# Patient Record
Sex: Male | Born: 1935 | Race: White | Hispanic: No | Marital: Married | Smoking: Former smoker
Health system: Southern US, Community
[De-identification: ages and names within clinical notes are randomized; demographics above are authoritative.]

## PROBLEM LIST (undated history)

## (undated) ENCOUNTER — Emergency Department (HOSPITAL_COMMUNITY): Payer: Medicare Other

## (undated) DIAGNOSIS — E785 Hyperlipidemia, unspecified: Secondary | ICD-10-CM

## (undated) DIAGNOSIS — E119 Type 2 diabetes mellitus without complications: Secondary | ICD-10-CM

---

## 1997-06-22 ENCOUNTER — Ambulatory Visit (HOSPITAL_COMMUNITY): Admission: RE | Admit: 1997-06-22 | Discharge: 1997-06-22 | Payer: Self-pay | Admitting: Plastic Surgery

## 1998-08-03 ENCOUNTER — Ambulatory Visit (HOSPITAL_BASED_OUTPATIENT_CLINIC_OR_DEPARTMENT_OTHER): Admission: RE | Admit: 1998-08-03 | Discharge: 1998-08-03 | Payer: Self-pay | Admitting: Plastic Surgery

## 1999-06-10 ENCOUNTER — Ambulatory Visit (HOSPITAL_COMMUNITY): Admission: RE | Admit: 1999-06-10 | Discharge: 1999-06-10 | Payer: Self-pay | Admitting: *Deleted

## 1999-07-01 ENCOUNTER — Inpatient Hospital Stay (HOSPITAL_COMMUNITY): Admission: RE | Admit: 1999-07-01 | Discharge: 1999-07-08 | Payer: Self-pay

## 1999-07-01 ENCOUNTER — Encounter: Payer: Self-pay | Admitting: Anesthesiology

## 2002-07-29 ENCOUNTER — Ambulatory Visit (HOSPITAL_COMMUNITY): Admission: RE | Admit: 2002-07-29 | Discharge: 2002-07-29 | Payer: Self-pay | Admitting: *Deleted

## 2002-11-25 ENCOUNTER — Ambulatory Visit (HOSPITAL_COMMUNITY): Admission: RE | Admit: 2002-11-25 | Discharge: 2002-11-25 | Payer: Self-pay | Admitting: General Surgery

## 2014-10-30 ENCOUNTER — Emergency Department (HOSPITAL_COMMUNITY): Payer: Medicare Other

## 2014-10-30 ENCOUNTER — Emergency Department (HOSPITAL_COMMUNITY)
Admission: EM | Admit: 2014-10-30 | Discharge: 2014-10-31 | Disposition: A | Discharge status: Home / Self Care | Payer: Medicare Other | Attending: Emergency Medicine | Admitting: Emergency Medicine

## 2014-10-30 ENCOUNTER — Encounter (HOSPITAL_COMMUNITY): Payer: Self-pay | Admitting: Family Medicine

## 2014-10-30 DIAGNOSIS — Z79899 Other long term (current) drug therapy: Secondary | ICD-10-CM | POA: Diagnosis not present

## 2014-10-30 DIAGNOSIS — R112 Nausea with vomiting, unspecified: Secondary | ICD-10-CM | POA: Diagnosis not present

## 2014-10-30 DIAGNOSIS — E119 Type 2 diabetes mellitus without complications: Secondary | ICD-10-CM | POA: Insufficient documentation

## 2014-10-30 DIAGNOSIS — R2689 Other abnormalities of gait and mobility: Secondary | ICD-10-CM | POA: Diagnosis not present

## 2014-10-30 DIAGNOSIS — Z87891 Personal history of nicotine dependence: Secondary | ICD-10-CM | POA: Insufficient documentation

## 2014-10-30 DIAGNOSIS — R269 Unspecified abnormalities of gait and mobility: Secondary | ICD-10-CM

## 2014-10-30 DIAGNOSIS — R5383 Other fatigue: Secondary | ICD-10-CM | POA: Diagnosis present

## 2014-10-30 HISTORY — DX: Hyperlipidemia, unspecified: E78.5

## 2014-10-30 HISTORY — DX: Type 2 diabetes mellitus without complications: E11.9

## 2014-10-30 LAB — DIFFERENTIAL
Basophils Absolute: 0 10*3/uL (ref 0.0–0.1)
Basophils Relative: 0 %
EOS ABS: 0 10*3/uL (ref 0.0–0.7)
EOS PCT: 0 %
LYMPHS PCT: 24 %
Lymphs Abs: 2.2 10*3/uL (ref 0.7–4.0)
MONO ABS: 0.6 10*3/uL (ref 0.1–1.0)
MONOS PCT: 6 %
NEUTROS ABS: 6.5 10*3/uL (ref 1.7–7.7)
Neutrophils Relative %: 70 %

## 2014-10-30 LAB — COMPREHENSIVE METABOLIC PANEL
ALBUMIN: 3.9 g/dL (ref 3.5–5.0)
ALK PHOS: 48 U/L (ref 38–126)
ALT: 13 U/L — ABNORMAL LOW (ref 17–63)
ANION GAP: 9 (ref 5–15)
AST: 24 U/L (ref 15–41)
BILIRUBIN TOTAL: 1 mg/dL (ref 0.3–1.2)
BUN: 10 mg/dL (ref 6–20)
CALCIUM: 9.2 mg/dL (ref 8.9–10.3)
CO2: 26 mmol/L (ref 22–32)
Chloride: 100 mmol/L — ABNORMAL LOW (ref 101–111)
Creatinine, Ser: 0.81 mg/dL (ref 0.61–1.24)
GLUCOSE: 107 mg/dL — AB (ref 65–99)
Potassium: 3.4 mmol/L — ABNORMAL LOW (ref 3.5–5.1)
Sodium: 135 mmol/L (ref 135–145)
TOTAL PROTEIN: 7.5 g/dL (ref 6.5–8.1)

## 2014-10-30 LAB — CBC
HEMATOCRIT: 42.8 % (ref 39.0–52.0)
Hemoglobin: 14.8 g/dL (ref 13.0–17.0)
MCH: 31.1 pg (ref 26.0–34.0)
MCHC: 34.6 g/dL (ref 30.0–36.0)
MCV: 89.9 fL (ref 78.0–100.0)
Platelets: 177 10*3/uL (ref 150–400)
RBC: 4.76 MIL/uL (ref 4.22–5.81)
RDW: 13 % (ref 11.5–15.5)
WBC: 9.2 10*3/uL (ref 4.0–10.5)

## 2014-10-30 LAB — I-STAT CHEM 8, ED
BUN: 13 mg/dL (ref 6–20)
BUN: 12 mg/dL (ref 6–20)
CALCIUM ION: 1.15 mmol/L (ref 1.13–1.30)
CHLORIDE: 102 mmol/L (ref 101–111)
Calcium, Ion: 1.18 mmol/L (ref 1.13–1.30)
Chloride: 101 mmol/L (ref 101–111)
Creatinine, Ser: 0.8 mg/dL (ref 0.61–1.24)
Creatinine, Ser: 0.7 mg/dL (ref 0.61–1.24)
GLUCOSE: 102 mg/dL — AB (ref 65–99)
Glucose, Bld: 123 mg/dL — ABNORMAL HIGH (ref 65–99)
HCT: 45 % (ref 39.0–52.0)
HCT: 46 % (ref 39.0–52.0)
Hemoglobin: 15.3 g/dL (ref 13.0–17.0)
Hemoglobin: 15.6 g/dL (ref 13.0–17.0)
Potassium: 3.7 mmol/L (ref 3.5–5.1)
Potassium: 3.5 mmol/L (ref 3.5–5.1)
Sodium: 140 mmol/L (ref 135–145)
Sodium: 139 mmol/L (ref 135–145)
TCO2: 26 mmol/L (ref 0–100)
TCO2: 24 mmol/L (ref 0–100)

## 2014-10-30 LAB — CBG MONITORING, ED: Glucose-Capillary: 105 mg/dL — ABNORMAL HIGH (ref 65–99)

## 2014-10-30 LAB — I-STAT TROPONIN, ED: TROPONIN I, POC: 0 ng/mL (ref 0.00–0.08)

## 2014-10-30 LAB — PROTIME-INR
INR: 1.16 (ref 0.00–1.49)
Prothrombin Time: 15 seconds (ref 11.6–15.2)

## 2014-10-30 LAB — URINALYSIS, ROUTINE W REFLEX MICROSCOPIC
Bilirubin Urine: NEGATIVE
Glucose, UA: NEGATIVE mg/dL
Hgb urine dipstick: NEGATIVE
Ketones, ur: NEGATIVE mg/dL
Leukocytes, UA: NEGATIVE
Nitrite: NEGATIVE
Protein, ur: NEGATIVE mg/dL
Specific Gravity, Urine: 1.01 (ref 1.005–1.030)
Urobilinogen, UA: 0.2 mg/dL (ref 0.0–1.0)
pH: 8 (ref 5.0–8.0)

## 2014-10-30 LAB — ETHANOL: Alcohol, Ethyl (B): <5 mg/dL (ref <5)

## 2014-10-30 LAB — RAPID URINE DRUG SCREEN, HOSP PERFORMED
Amphetamines: NOT DETECTED
Barbiturates: NOT DETECTED
Benzodiazepines: NOT DETECTED
Cocaine: NOT DETECTED
OPIATES: NOT DETECTED
TETRAHYDROCANNABINOL: NOT DETECTED

## 2014-10-30 LAB — APTT: aPTT: 33 seconds (ref 24–37)

## 2014-10-30 MED ORDER — LORAZEPAM 1 MG PO TABS
1.0000 mg | ORAL_TABLET | Freq: Once | ORAL | Status: AC
  Administered 2014-10-30: 1 mg via ORAL
  Filled 2014-10-30: qty 1

## 2014-10-30 NOTE — ED Notes (Signed)
Pt here for unsteady gait that started yesterday. Denies any other problems.

## 2014-10-30 NOTE — ED Notes (Signed)
Pt refused to allow temperature to be taken.

## 2014-10-30 NOTE — ED Notes (Signed)
Called pt no answer °

## 2014-10-30 NOTE — ED Notes (Signed)
Called pt several times with no answer, pt returned to front desk asking when he would be called

## 2014-10-30 NOTE — Discharge Instructions (Signed)
Fall Prevention in the Home  There is no evidence of a new stroke. Followup with your doctor. Return to the ED if you develop new or worsening symptoms. Falls can cause injuries and can affect people from all age groups. There are many simple things that you can do to make your home safe and to help prevent falls. WHAT CAN I DO ON THE OUTSIDE OF MY HOME?  Regularly repair the edges of walkways and driveways and fix any cracks.  Remove high doorway thresholds.  Trim any shrubbery on the main path into your home.  Use bright outdoor lighting.  Clear walkways of debris and clutter, including tools and rocks.  Regularly check that handrails are securely fastened and in good repair. Both sides of any steps should have handrails.  Install guardrails along the edges of any raised decks or porches.  Have leaves, snow, and ice cleared regularly.  Use sand or salt on walkways during winter months.  In the garage, clean up any spills right away, including grease or oil spills. WHAT CAN I DO IN THE BATHROOM?  Use night lights.  Install grab bars by the toilet and in the tub and shower. Do not use towel bars as grab bars.  Use non-skid mats or decals on the floor of the tub or shower.  If you need to sit down while you are in the shower, use a plastic, non-slip stool.Marland Kitchen  Keep the floor dry. Immediately clean up any water that spills on the floor.  Remove soap buildup in the tub or shower on a regular basis.  Attach bath mats securely with double-sided non-slip rug tape.  Remove throw rugs and other tripping hazards from the floor. WHAT CAN I DO IN THE BEDROOM?  Use night lights.  Make sure that a bedside light is easy to reach.  Do not use oversized bedding that drapes onto the floor.  Have a firm chair that has side arms to use for getting dressed.  Remove throw rugs and other tripping hazards from the floor. WHAT CAN I DO IN THE KITCHEN?   Clean up any spills right  away.  Avoid walking on wet floors.  Place frequently used items in easy-to-reach places.  If you need to reach for something above you, use a sturdy step stool that has a grab bar.  Keep electrical cables out of the way.  Do not use floor polish or wax that makes floors slippery. If you have to use wax, make sure that it is non-skid floor wax.  Remove throw rugs and other tripping hazards from the floor. WHAT CAN I DO IN THE STAIRWAYS?  Do not leave any items on the stairs.  Make sure that there are handrails on both sides of the stairs. Fix handrails that are broken or loose. Make sure that handrails are as long as the stairways.  Check any carpeting to make sure that it is firmly attached to the stairs. Fix any carpet that is loose or worn.  Avoid having throw rugs at the top or bottom of stairways, or secure the rugs with carpet tape to prevent them from moving.  Make sure that you have a light switch at the top of the stairs and the bottom of the stairs. If you do not have them, have them installed. WHAT ARE SOME OTHER FALL PREVENTION TIPS?  Wear closed-toe shoes that fit well and support your feet. Wear shoes that have rubber soles or low heels.  When you use  a stepladder, make sure that it is completely opened and that the sides are firmly locked. Have someone hold the ladder while you are using it. Do not climb a closed stepladder.  Add color or contrast paint or tape to grab bars and handrails in your home. Place contrasting color strips on the first and last steps.  Use mobility aids as needed, such as canes, walkers, scooters, and crutches.  Turn on lights if it is dark. Replace any light bulbs that burn out.  Set up furniture so that there are clear paths. Keep the furniture in the same spot.  Fix any uneven floor surfaces.  Choose a carpet design that does not hide the edge of steps of a stairway.  Be aware of any and all pets.  Review your medicines with your  healthcare provider. Some medicines can cause dizziness or changes in blood pressure, which increase your risk of falling. Talk with your health care provider about other ways that you can decrease your risk of falls. This may include working with a physical therapist or trainer to improve your strength, balance, and endurance.   This information is not intended to replace advice given to you by your health care provider. Make sure you discuss any questions you have with your health care provider.   Document Released: 12/23/2001 Document Revised: 05/19/2014 Document Reviewed: 02/06/2014 Elsevier Interactive Patient Education [employer].

## 2014-10-30 NOTE — ED Notes (Signed)
[facility] contacted to transport patient to home 

## 2014-10-30 NOTE — ED Provider Notes (Signed)
CSN: [id number]     Arrival date & time 10/30/14  1230 History   First MD Initiated Contact with Patient 10/30/14 1620     Chief Complaint  Patient presents with  . Gait Problem     (Consider location/radiation/quality/duration/timing/severity/associated sxs/prior Treatment) HPI Comments: Patient is a poor historian.  He is not sure why he is in the hospital.  I report he was sent for unsteady gait for the past couple days with feeling like he was going to fall over. He denies any headache, neck pain, back pain, chest pain or abdominal pain. Denies any chronic medications other than vitamins. Denies any heart or lung problems. He states he's been having difficulty walking and had a fall yesterday did not hit his head. Did not lose consciousness. No focal numbness tingling. No vision changes. No chest pain or shortness of breath. No abdominal pain. Had one episode of vomiting today. No focal weakness, numbness or tingling. No difficulty with speech or swallowing.  The history is provided by the patient. The history is limited by the condition of the patient.    Past Medical History  Diagnosis Date  . Diabetes mellitus without complication ([place])   . Hyperlipidemia    History reviewed. No pertinent past surgical history. History reviewed. No pertinent family history. Social History  Substance Use Topics  . Smoking status: Former Research scientist (life sciences)  . Smokeless tobacco: None  . Alcohol Use: Yes    Review of Systems  Constitutional: Positive for fatigue. Negative for fever, activity change and appetite change.  Eyes: Negative for visual disturbance.  Respiratory: Negative for cough, chest tightness and shortness of breath.   Gastrointestinal: Positive for nausea and vomiting. Negative for abdominal pain.  Genitourinary: Negative for dysuria and hematuria.  Musculoskeletal: Positive for gait problem. Negative for myalgias and arthralgias.  Neurological: Negative for dizziness, weakness,  light-headedness and headaches.  A complete 10 system review of systems was obtained and all systems are negative except as noted in the HPI and PMH.      Allergies  Review of patient's allergies indicates no known allergies.  Home Medications   Prior to Admission medications   Medication Sig Start Date End Date Taking? Authorizing Provider  Multiple Vitamin (MULTIVITAMIN WITH MINERALS) TABS tablet Take 1 tablet by mouth daily.   Yes Historical Provider, MD  sennosides-docusate sodium (SENOKOT-S) 8.6-50 MG tablet Take 1 tablet by mouth daily.   Yes Historical Provider, MD   BP 100/82 mmHg  Pulse 77  Temp(Src) 97.7 F (36.5 C) (Oral)  Resp 18  Ht '5\' 4"'$  (1.626 m)  Wt 165 lb (74.844 kg)  BMI 28.31 kg/m2  SpO2 94% Physical Exam  Constitutional: He is oriented to person, place, and time. He appears well-developed and well-nourished. No distress.  HENT:  Head: Normocephalic and atraumatic.  Mouth/Throat: Oropharynx is clear and moist. No oropharyngeal exudate.  Eyes: Conjunctivae and EOM are normal. Pupils are equal, round, and reactive to light.  Neck: Normal range of motion. Neck supple.  No meningismus.  Cardiovascular: Normal rate, regular rhythm, normal heart sounds and intact distal pulses.   No murmur heard. Pulmonary/Chest: Effort normal and breath sounds normal. No respiratory distress.  Abdominal: Soft. There is no tenderness. There is no rebound and no guarding.  Musculoskeletal: Normal range of motion. He exhibits no edema or tenderness.  Neurological: He is alert and oriented to person, place, and time. No cranial nerve deficit. He exhibits normal muscle tone. Coordination normal.  No ataxia on finger to  nose bilaterally. No pronator drift. 5/5 strength throughout. CN 2-12 intact. Positive Romberg. Equal grip strength. Sensation intact. Gait is slightly ataxic. Left-sided horizontal nystagmus.  Skin: Skin is warm.  Psychiatric: He has a normal mood and affect. His  behavior is normal.  Nursing note and vitals reviewed.   ED Course  Procedures (including critical care time) Labs Review Labs Reviewed  URINALYSIS, ROUTINE W REFLEX MICROSCOPIC (NOT AT [facility]) - Abnormal; Notable for the following:    APPearance CLOUDY (*)    All other components within normal limits  COMPREHENSIVE METABOLIC PANEL - Abnormal; Notable for the following:    Potassium 3.4 (*)    Chloride 100 (*)    Glucose, Bld 107 (*)    ALT 13 (*)    All other components within normal limits  CBG MONITORING, ED - Abnormal; Notable for the following:    Glucose-Capillary 105 (*)    All other components within normal limits  I-STAT CHEM 8, ED - Abnormal; Notable for the following:    Glucose, Bld 123 (*)    All other components within normal limits  I-STAT CHEM 8, ED - Abnormal; Notable for the following:    Glucose, Bld 102 (*)    All other components within normal limits  ETHANOL  PROTIME-INR  APTT  CBC  DIFFERENTIAL  URINE RAPID DRUG SCREEN, HOSP PERFORMED  I-STAT TROPOININ, ED  CBG MONITORING, ED    Imaging Review Ct Head Wo Contrast  10/30/2014  CLINICAL DATA:  Difficulty walking for 2 days. EXAM: CT HEAD WITHOUT CONTRAST TECHNIQUE: Contiguous axial images were obtained from the base of the skull through the vertex without intravenous contrast. COMPARISON:  None. FINDINGS: Patchy and confluent hypoattenuation in the subcortical and periventricular deep white matter is consistent with chronic microvascular ischemic change. There is cortical atrophy. No evidence of acute abnormality including hemorrhage, infarct, mass lesion, mass effect, midline shift or abnormal extra-axial fluid collection is identified. There is no hydrocephalus or pneumocephalus. The calvarium is intact. Imaged paranasal sinuses and mastoid air cells are clear. IMPRESSION: No acute abnormality. Atrophy and chronic microvascular ischemic change. Electronically Signed   By: Inge Rise M.D.   On:  10/30/2014 18:02   Mr Brain Wo Contrast  10/30/2014  CLINICAL DATA:  Initial evaluation for one-day history of unsteady gait. EXAM: MRI HEAD WITHOUT CONTRAST TECHNIQUE: Multiplanar, multiecho pulse sequences of the brain and surrounding structures were obtained without intravenous contrast. COMPARISON:  Prior CT from earlier the same day. FINDINGS: Study is moderately degraded by motion artifact. Diffuse prominence of the CSF containing spaces is compatible with generalized age-related cerebral atrophy. Hippocampi are markedly atrophic. Patchy T2/FLAIR hyperintensity within the periventricular and deep white matter both cerebral hemispheres most consistent with chronic small vessel ischemic disease, moderate in nature. Remote lacunar infarcts within the right thalamus and posterior left lentiform nucleus. Additional small remote lacunar infarct within the left thalamus. No abnormal foci of restricted diffusion to suggest acute intracranial infarct. Gray-white matter differentiation maintained. Normal intravascular flow voids are preserved. No acute or chronic intracranial hemorrhage. There is an oblong extra-axial mass measuring approximately 13 x 7 x 10 mm along the posterior aspect of the left petrous apex in the left pre pontine cistern (series 6, image 9). This lesion appears partially calcified on prior CT, and likely reflects a small meningioma. No significant mass effect. This lesion closely approximates the basilar artery medially without displacement or impingement. No other mass lesion. No midline shift or mass effect. Ventricular prominence  related to global parenchymal volume loss present without hydrocephalus. No extra-axial fluid collection. Craniocervical junction within normal limits. Pituitary gland normal.  No acute abnormality about the orbits. Paranasal sinuses are grossly clear. No mastoid effusion. Inner ear structures grossly unremarkable. Bone marrow signal intensity within normal limits.  Mild degenerative changes about the dens. Scalp soft tissues within normal limits. IMPRESSION: 1. No acute intracranial infarct or other process identified. 2. Advanced age-related cerebral atrophy with moderate chronic small vessel ischemic disease and remote lacunar infarcts within the bilateral thalami and left basal ganglia. 3. 13 x 7 x 10 mm extra-axial mass within the left pre pontine cistern, likely a small meningioma. No associated edema. Electronically Signed   By: Jeannine Boga M.D.   On: 10/30/2014 22:39   I have personally reviewed and evaluated these images and lab results as part of my medical decision-making.   EKG Interpretation   Date/Time:  Friday October 30 2014 17:14:26 EDT Ventricular Rate:  76 PR Interval:  196 QRS Duration: 95 QT Interval:  418 QTC Calculation: 470 R Axis:   3 Text Interpretation:  Sinus rhythm No significant change was found  Confirmed by Wyvonnia Dusky  MD, Emanie Behan [phone]) on 10/30/2014 5:21:02 PM      MDM   Final diagnoses:  Gait disturbance   Patient with difficulty walking for the past several days. States he had one fall. Denies any trauma. No focal neurological deficits but does have nystagmus and positive Romberg with ataxic gait.  CT head negative.  Labs unremarkable.  With nystagmus and difficulty walking, will proceed with MRI.  MRI is negative for acute infarct. There are areas of atrophy and chronic small vessel disease of the right lacunar infarcts. There is a small likely meningioma.  Patient is able to ambulate without assistance. Denies any dizziness. He appears stable for discharge. No evidence of infection.  No family with patient after prolonged ED stay.  Does appear to have some element of dementia. He does not know any other phone numbers and no phone numbers in contacts in his cell phone.  Nurse spoke with patient's wife but on subsequent attempts to contact her, the number was busy.  [facility] transported patient home but  there was no answer so he was brought back to the ED.  Will need to stay in ED overnight for his safety and involve social work in the morning.   Ezequiel Essex, MD 10/31/14 [phone]

## 2014-10-30 NOTE — ED Notes (Signed)
Pt cbg 105

## 2014-10-31 NOTE — ED Notes (Addendum)
Pt was transported back to the ED after there was no answer at his home.  Pt is disoriented to the situation.  He does follow commands however appears to wonder.  Pt did agree to try to sleep.  Pt requested that both rails on his bed be left up so that "I have something to grab."

## 2014-10-31 NOTE — ED Notes (Signed)
Tech safety sitting the pt. Pt stated that he wanted to "go home and go to bed".

## 2014-10-31 NOTE — ED Notes (Signed)
Pt taken home by [facility]

## 2014-10-31 NOTE — ED Notes (Signed)
Received information that [facility]O had gone to the home to do a wellfare check on pt's wife.  She is home and answered the door.  Spoke to MD who informed RN that pt could be transferred home via [facility]

## 2015-08-31 ENCOUNTER — Other Ambulatory Visit: Payer: Self-pay | Admitting: Family Medicine

## 2015-08-31 DIAGNOSIS — R9389 Abnormal findings on diagnostic imaging of other specified body structures: Secondary | ICD-10-CM

## 2015-09-10 ENCOUNTER — Other Ambulatory Visit: Payer: Medicare Other

## 2015-11-10 ENCOUNTER — Ambulatory Visit: Payer: Medicare Other | Admitting: Podiatry

## 2015-11-18 ENCOUNTER — Ambulatory Visit: Payer: Medicare Other | Admitting: Podiatry

## 2015-11-24 ENCOUNTER — Ambulatory Visit: Payer: Medicare Other | Admitting: Podiatry

## 2015-12-08 ENCOUNTER — Ambulatory Visit: Payer: Medicare Other | Admitting: Podiatry

## 2016-02-10 ENCOUNTER — Other Ambulatory Visit: Payer: Self-pay | Admitting: Family Medicine

## 2016-02-10 DIAGNOSIS — R079 Chest pain, unspecified: Secondary | ICD-10-CM

## 2016-02-10 DIAGNOSIS — R9389 Abnormal findings on diagnostic imaging of other specified body structures: Secondary | ICD-10-CM

## 2016-02-14 ENCOUNTER — Ambulatory Visit
Admission: RE | Admit: 2016-02-14 | Discharge: 2016-02-14 | Disposition: A | Discharge status: Home / Self Care | Payer: Medicare Other | Source: Ambulatory Visit | Attending: Family Medicine | Admitting: Family Medicine

## 2016-02-14 DIAGNOSIS — R9389 Abnormal findings on diagnostic imaging of other specified body structures: Secondary | ICD-10-CM

## 2016-02-14 DIAGNOSIS — R079 Chest pain, unspecified: Secondary | ICD-10-CM

## 2016-02-14 MED ORDER — IOPAMIDOL (ISOVUE-300) INJECTION 61%
75.0000 mL | Freq: Once | INTRAVENOUS | Status: AC | PRN
  Administered 2016-02-14: 75 mL via INTRAVENOUS

## 2016-02-15 ENCOUNTER — Encounter: Payer: Self-pay | Admitting: *Deleted

## 2016-02-15 ENCOUNTER — Telehealth: Payer: Self-pay | Admitting: *Deleted

## 2016-02-15 DIAGNOSIS — R911 Solitary pulmonary nodule: Secondary | ICD-10-CM

## 2016-02-15 DIAGNOSIS — R918 Other nonspecific abnormal finding of lung field: Secondary | ICD-10-CM | POA: Insufficient documentation

## 2016-02-15 NOTE — Telephone Encounter (Signed)
Oncology Nurse Navigator Documentation  Oncology Nurse Navigator Flowsheets 02/15/2016  Navigator Location [facility]  Referral date to RadOnc/MedOnc 02/15/2016  Navigator Encounter Type Other;Telephone/I received referral today on Mitchell Grant.  Per [facility] protocol, Dr. Harrington Challenger ordered PET scan. I completed order in EPIC.  I called Mitchell Grant to update him on next steps but was unable to reach.  I updated Dr. Julien Nordmann on the above information.   Telephone Outgoing Call  Abnormal Finding Date 02/14/2016  Treatment Phase Abnormal Scans  Barriers/Navigation Needs Coordination of Care  Interventions Coordination of Care  Coordination of Care Radiology;Appts  Acuity Level 2  Acuity Level 2 Assistance expediting appointments;Other  Time Spent with Patient 30

## 2016-02-16 ENCOUNTER — Telehealth: Payer: Self-pay | Admitting: *Deleted

## 2016-02-16 NOTE — Telephone Encounter (Signed)
Oncology Nurse Navigator Documentation  Oncology Nurse Navigator Flowsheets 02/16/2016  Navigator Location [facility]  Navigator Encounter Type Telephone/I called to follow up with Mr. Kishi.  I spoke with Ms. Loja and she stated patient was asleep and to call back tomorrow.  I called the referring office and spoke with [facility].  I updated her on wife's response.  Beth was thankful for the call and will update Dr. Harrington Challenger.   Telephone Outgoing Call  Treatment Phase Abnormal Scans  Barriers/Navigation Needs Education  Education Other  Interventions Education  Acuity Level 2  Acuity Level 2 Educational needs  Time Spent with Patient 30

## 2016-02-22 ENCOUNTER — Other Ambulatory Visit: Payer: Self-pay | Admitting: Family Medicine

## 2016-03-06 ENCOUNTER — Emergency Department (HOSPITAL_COMMUNITY): Payer: Medicare Other

## 2016-03-06 ENCOUNTER — Encounter (HOSPITAL_COMMUNITY): Payer: Self-pay | Admitting: Emergency Medicine

## 2016-03-06 ENCOUNTER — Inpatient Hospital Stay (HOSPITAL_COMMUNITY)
Admission: EM | Admit: 2016-03-06 | Discharge: 2016-03-09 | DRG: 071 | Disposition: A | Discharge status: Hospice | Payer: Medicare Other | Attending: Internal Medicine | Admitting: Internal Medicine

## 2016-03-06 DIAGNOSIS — Z66 Do not resuscitate: Secondary | ICD-10-CM | POA: Diagnosis present

## 2016-03-06 DIAGNOSIS — E785 Hyperlipidemia, unspecified: Secondary | ICD-10-CM | POA: Diagnosis present

## 2016-03-06 DIAGNOSIS — I160 Hypertensive urgency: Secondary | ICD-10-CM | POA: Diagnosis not present

## 2016-03-06 DIAGNOSIS — M6282 Rhabdomyolysis: Secondary | ICD-10-CM

## 2016-03-06 DIAGNOSIS — G9341 Metabolic encephalopathy: Secondary | ICD-10-CM | POA: Diagnosis not present

## 2016-03-06 DIAGNOSIS — R410 Disorientation, unspecified: Secondary | ICD-10-CM

## 2016-03-06 DIAGNOSIS — G9340 Encephalopathy, unspecified: Secondary | ICD-10-CM | POA: Diagnosis not present

## 2016-03-06 DIAGNOSIS — Z515 Encounter for palliative care: Secondary | ICD-10-CM | POA: Diagnosis present

## 2016-03-06 DIAGNOSIS — N179 Acute kidney failure, unspecified: Secondary | ICD-10-CM | POA: Diagnosis present

## 2016-03-06 DIAGNOSIS — Z8673 Personal history of transient ischemic attack (TIA), and cerebral infarction without residual deficits: Secondary | ICD-10-CM

## 2016-03-06 DIAGNOSIS — E119 Type 2 diabetes mellitus without complications: Secondary | ICD-10-CM | POA: Diagnosis present

## 2016-03-06 DIAGNOSIS — W19XXXA Unspecified fall, initial encounter: Secondary | ICD-10-CM

## 2016-03-06 DIAGNOSIS — R627 Adult failure to thrive: Secondary | ICD-10-CM | POA: Diagnosis present

## 2016-03-06 DIAGNOSIS — C3432 Malignant neoplasm of lower lobe, left bronchus or lung: Secondary | ICD-10-CM | POA: Diagnosis present

## 2016-03-06 DIAGNOSIS — E876 Hypokalemia: Secondary | ICD-10-CM | POA: Diagnosis present

## 2016-03-06 DIAGNOSIS — Z87891 Personal history of nicotine dependence: Secondary | ICD-10-CM

## 2016-03-06 DIAGNOSIS — I10 Essential (primary) hypertension: Secondary | ICD-10-CM | POA: Diagnosis present

## 2016-03-06 DIAGNOSIS — Z79899 Other long term (current) drug therapy: Secondary | ICD-10-CM

## 2016-03-06 DIAGNOSIS — E869 Volume depletion, unspecified: Secondary | ICD-10-CM | POA: Diagnosis present

## 2016-03-06 DIAGNOSIS — F03918 Unspecified dementia, unspecified severity, with other behavioral disturbance: Secondary | ICD-10-CM

## 2016-03-06 DIAGNOSIS — D72829 Elevated white blood cell count, unspecified: Secondary | ICD-10-CM | POA: Diagnosis present

## 2016-03-06 DIAGNOSIS — F0391 Unspecified dementia with behavioral disturbance: Secondary | ICD-10-CM | POA: Diagnosis present

## 2016-03-06 DIAGNOSIS — R918 Other nonspecific abnormal finding of lung field: Secondary | ICD-10-CM

## 2016-03-06 DIAGNOSIS — E860 Dehydration: Secondary | ICD-10-CM | POA: Diagnosis present

## 2016-03-06 LAB — COMPREHENSIVE METABOLIC PANEL
ALBUMIN: 4.2 g/dL (ref 3.5–5.0)
ALT: 44 U/L (ref 17–63)
ANION GAP: 13 (ref 5–15)
AST: 56 U/L — ABNORMAL HIGH (ref 15–41)
Alkaline Phosphatase: 52 U/L (ref 38–126)
BILIRUBIN TOTAL: 3 mg/dL — AB (ref 0.3–1.2)
BUN: 26 mg/dL — ABNORMAL HIGH (ref 6–20)
CO2: 27 mmol/L (ref 22–32)
Calcium: 9.7 mg/dL (ref 8.9–10.3)
Chloride: 105 mmol/L (ref 101–111)
Creatinine, Ser: 0.85 mg/dL (ref 0.61–1.24)
GLUCOSE: 120 mg/dL — AB (ref 65–99)
Potassium: 3.2 mmol/L — ABNORMAL LOW (ref 3.5–5.1)
Sodium: 145 mmol/L (ref 135–145)
TOTAL PROTEIN: 7.8 g/dL (ref 6.5–8.1)

## 2016-03-06 LAB — CBC WITH DIFFERENTIAL/PLATELET
BASOS PCT: 0 %
Basophils Absolute: 0 10*3/uL (ref 0.0–0.1)
Eosinophils Absolute: 0 10*3/uL (ref 0.0–0.7)
Eosinophils Relative: 0 %
HEMATOCRIT: 44.5 % (ref 39.0–52.0)
Hemoglobin: 15.9 g/dL (ref 13.0–17.0)
Lymphocytes Relative: 13 %
Lymphs Abs: 1.7 10*3/uL (ref 0.7–4.0)
MCH: 30.5 pg (ref 26.0–34.0)
MCHC: 35.7 g/dL (ref 30.0–36.0)
MCV: 85.2 fL (ref 78.0–100.0)
MONO ABS: 0.8 10*3/uL (ref 0.1–1.0)
MONOS PCT: 6 %
NEUTROS ABS: 10.9 10*3/uL — AB (ref 1.7–7.7)
Neutrophils Relative %: 81 %
Platelets: 164 10*3/uL (ref 150–400)
RBC: 5.22 MIL/uL (ref 4.22–5.81)
RDW: 13.6 % (ref 11.5–15.5)
WBC: 13.4 10*3/uL — ABNORMAL HIGH (ref 4.0–10.5)

## 2016-03-06 LAB — URINALYSIS, ROUTINE W REFLEX MICROSCOPIC
BILIRUBIN URINE: NEGATIVE
Bacteria, UA: NONE SEEN
GLUCOSE, UA: NEGATIVE mg/dL
KETONES UR: 20 mg/dL — AB
LEUKOCYTES UA: NEGATIVE
NITRITE: NEGATIVE
PH: 5 (ref 5.0–8.0)
Protein, ur: NEGATIVE mg/dL
Specific Gravity, Urine: 1.024 (ref 1.005–1.030)

## 2016-03-06 LAB — AMMONIA: Ammonia: 12 umol/L (ref 9–35)

## 2016-03-06 LAB — LACTIC ACID, PLASMA: Lactic Acid, Venous: 1.2 mmol/L (ref 0.5–1.9)

## 2016-03-06 LAB — CK: Total CK: 671 U/L — ABNORMAL HIGH (ref 49–397)

## 2016-03-06 LAB — ETHANOL: Alcohol, Ethyl (B): <5 mg/dL (ref <5)

## 2016-03-06 MED ORDER — ONDANSETRON HCL 4 MG PO TABS: 4.0000 mg | ORAL_TABLET | Freq: Four times a day (QID) | ORAL | Status: DC | PRN

## 2016-03-06 MED ORDER — SODIUM CHLORIDE 0.9 % IV SOLN
INTRAVENOUS | Status: DC
  Administered 2016-03-07: 01:00:00 via INTRAVENOUS

## 2016-03-06 MED ORDER — AMLODIPINE BESYLATE 5 MG PO TABS: 5.0000 mg | ORAL_TABLET | Freq: Every day | ORAL | Status: DC

## 2016-03-06 MED ORDER — ACETAMINOPHEN 325 MG PO TABS: 650.0000 mg | ORAL_TABLET | Freq: Four times a day (QID) | ORAL | Status: DC | PRN

## 2016-03-06 MED ORDER — ACETAMINOPHEN 650 MG RE SUPP
650.0000 mg | Freq: Four times a day (QID) | RECTAL | Status: DC | PRN
Start: 2016-03-06 — End: 2016-03-09

## 2016-03-06 MED ORDER — SODIUM CHLORIDE 0.9 % IV BOLUS (SEPSIS)
1000.0000 mL | Freq: Once | INTRAVENOUS | Status: AC
  Administered 2016-03-06: 1000 mL via INTRAVENOUS

## 2016-03-06 MED ORDER — ONDANSETRON HCL 4 MG/2ML IJ SOLN: 4.0000 mg | Freq: Four times a day (QID) | INTRAMUSCULAR | Status: DC | PRN

## 2016-03-06 MED ORDER — HYDRALAZINE HCL 20 MG/ML IJ SOLN
10.0000 mg | INTRAMUSCULAR | Status: DC | PRN
  Administered 2016-03-06 – 2016-03-09 (×3): 10 mg via INTRAVENOUS
  Filled 2016-03-06 (×3): qty 1

## 2016-03-06 NOTE — ED Triage Notes (Addendum)
Per EMS-states lives at home with wife-APS at residence due to hoarding-APS found patient on floor-states unwitnessed fall-unsure of LOC-no s/s's of distress-patient asleep on the stretcher-patient alert and oriented X 2-complaining of pain at back of head-history of dementia-patient is at baseline mentally

## 2016-03-06 NOTE — ED Notes (Signed)
Lab reports adding CK

## 2016-03-06 NOTE — H&P (Addendum)
History and Physical    Mitchell Grant DQQ:[id number] DOB: 03-04-1930 DOA: 03/06/2016  PCP: No PCP Per Patient  Patient coming from: Home.  History obtained from ER physician as patient is confused and no family at the bedside.  Chief Complaint: Confusion.  HPI: Mitchell Grant is a 83 y.o. male with unknown past medical history was brought to the ER after Adult Protective Services went to check on the patient and was found to be on the floor. Patient had urine and feces in his underwear. In the ER patient remained confused and oriented to his name only. Was afebrile. CT of the head and C-spine did not show anything acute. Blood pressure remained high. Patient is being admitted for further observation. Unable to reach patient's wife who usually lives with patient. Reviewing patient's charts last month patient had x-ray and CT scan of the chest which showed new mass concerning for malignancy.   ED Course: CT of the head and C-spine was unremarkable. UA did not show any signs of infection.  Review of Systems: As per HPI, rest all negative.   Past Medical History:  Diagnosis Date  . Diabetes mellitus without complication ([place])   . Hyperlipidemia     History reviewed. No pertinent surgical history.   reports that he has quit smoking. He has never used smokeless tobacco. He reports that he drinks alcohol. He reports that he does not use drugs.  No Known Allergies  Family History  Problem Relation Age of Onset  . Family history unknown: Yes    Prior to Admission medications   Medication Sig Start Date End Date Taking? Authorizing Provider  Multiple Vitamin (MULTIVITAMIN WITH MINERALS) TABS tablet Take 1 tablet by mouth daily.    Historical Provider, MD  sennosides-docusate sodium (SENOKOT-S) 8.6-50 MG tablet Take 1 tablet by mouth daily.    Historical Provider, MD    Physical Exam: Vitals:   03/06/16 1930 03/06/16 2039 03/06/16 2100 03/06/16 2303  BP: 182/75 173/86 183/90  (!) 212/98  Pulse: (!) 56 (!) 56 (!) 55 70  Resp: (!) '9 12 10 13  '$ Temp:      TempSrc:      SpO2: 95% 96% 96% 97%      Constitutional: Moderately built and nourished. Vitals:   03/06/16 1930 03/06/16 2039 03/06/16 2100 03/06/16 2303  BP: 182/75 173/86 183/90 (!) 212/98  Pulse: (!) 56 (!) 56 (!) 55 70  Resp: (!) '9 12 10 13  '$ Temp:      TempSrc:      SpO2: 95% 96% 96% 97%   Eyes: Anicteric no pallor. ENMT: No discharge from the ears eyes nose or mouth. Neck: No mass felt. No neck rigidity. Respiratory: No rhonchi or crepitations. Cardiovascular: S1-S2 heard no murmurs appreciated. Abdomen: Soft nontender bowel sounds present. Musculoskeletal: No edema no joint effusion. Skin: No rash. Neurologic: Alert awake oriented to his name. Moves all extremities. Appears confused. Pupils are equal and reacting to light. Psychiatric: Appears confused.   Labs on Admission: I have personally reviewed following labs and imaging studies  CBC:  Recent Labs Lab 03/06/16 1714  WBC 13.4*  NEUTROABS 10.9*  HGB 15.9  HCT 44.5  MCV 85.2  PLT 194   Basic Metabolic Panel:  Recent Labs Lab 03/06/16 1714  NA 145  K 3.2*  CL 105  CO2 27  GLUCOSE 120*  BUN 26*  CREATININE 0.85  CALCIUM 9.7   GFR: CrCl cannot be calculated (Unknown ideal weight.). Liver Function  Tests:  Recent Labs Lab 03/06/16 1714  AST 56*  ALT 44  ALKPHOS 52  BILITOT 3.0*  PROT 7.8  ALBUMIN 4.2   No results for input(s): LIPASE, AMYLASE in the last 168 hours.  Recent Labs Lab 03/06/16 1827  AMMONIA 12   Coagulation Profile: No results for input(s): INR, PROTIME in the last 168 hours. Cardiac Enzymes:  Recent Labs Lab 03/06/16 1714  CKTOTAL 671*   BNP (last 3 results) No results for input(s): PROBNP in the last 8760 hours. HbA1C: No results for input(s): HGBA1C in the last 72 hours. CBG: No results for input(s): GLUCAP in the last 168 hours. Lipid Profile: No results for input(s):  CHOL, HDL, LDLCALC, TRIG, CHOLHDL, LDLDIRECT in the last 72 hours. Thyroid Function Tests: No results for input(s): TSH, T4TOTAL, FREET4, T3FREE, THYROIDAB in the last 72 hours. Anemia Panel: No results for input(s): VITAMINB12, FOLATE, FERRITIN, TIBC, IRON, RETICCTPCT in the last 72 hours. Urine analysis:    Component Value Date/Time   COLORURINE AMBER (A) 03/06/2016 2243   APPEARANCEUR CLEAR 03/06/2016 2243   LABSPEC 1.024 03/06/2016 2243   PHURINE 5.0 03/06/2016 2243   GLUCOSEU NEGATIVE 03/06/2016 2243   HGBUR SMALL (A) 03/06/2016 2243   BILIRUBINUR NEGATIVE 03/06/2016 2243   KETONESUR 20 (A) 03/06/2016 2243   PROTEINUR NEGATIVE 03/06/2016 2243   UROBILINOGEN 0.2 10/30/2014 1348   NITRITE NEGATIVE 03/06/2016 2243   LEUKOCYTESUR NEGATIVE 03/06/2016 2243   Sepsis Labs: '@LABRCNTIP'$ (procalcitonin:4,lacticidven:4) )No results found for this or any previous visit (from the past 240 hour(s)).   Radiological Exams on Admission: Ct Head Wo Contrast  Result Date: 03/06/2016 CLINICAL DATA:  Patient found down today. EXAM: CT HEAD WITHOUT CONTRAST CT CERVICAL SPINE WITHOUT CONTRAST TECHNIQUE: Multidetector CT imaging of the head and cervical spine was performed following the standard protocol without intravenous contrast. Multiplanar CT image reconstructions of the cervical spine were also generated. COMPARISON:  Head CT scan and brain MRI 10/30/2014. FINDINGS: CT HEAD FINDINGS Brain: Atrophy and chronic microvascular ischemic change are identified. Small prepontine mass on the left measures approximately 1 cm in diameter and is most consistent with a meningioma, unchanged. No other mass is identified. No evidence of acute infarct, hemorrhage, midline shift or abnormal extra-axial fluid collection. No hydrocephalus or pneumocephalus. Vascular: Atherosclerosis noted. Skull: Intact. Sinuses/Orbits: Mild mucosal thickening left maxillary sinus is seen. No acute abnormality. Other: None. CT CERVICAL  SPINE FINDINGS Alignment: Facet mediated 0.3 cm anterolisthesis C4 on C5 is identified. Otherwise maintained. Convex right scoliosis is noted. Skull base and vertebrae: No fracture or worrisome lesion. 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 Soft tissues and spinal canal: Carotid atherosclerosis is identified. No hematoma. No prevertebral soft tissue swelling. Disc levels: Loss of disc space height is worst at C6-7. Scattered facet degenerative change appears worst on the left at C4-5. Upper chest: Lung apices are clear. Other: None. IMPRESSION: No acute abnormality head or cervical spine. Atrophy and chronic microvascular ischemic change. No change in a small prepontine meningioma. 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 Cervical spondylosis appears worst at C5-6. Atherosclerosis. Electronically Signed   By: Inge Rise M.D.   On: 03/06/2016 17:55   Ct Cervical Spine Wo Contrast  Result Date: 03/06/2016 CLINICAL DATA:  Patient found down today. EXAM: CT HEAD WITHOUT CONTRAST CT CERVICAL SPINE WITHOUT CONTRAST TECHNIQUE: Multidetector CT imaging of the head and cervical spine was performed following the standard protocol without intravenous contrast. Multiplanar CT image reconstructions of the cervical spine were also generated. COMPARISON:  Head CT  scan and brain MRI 10/30/2014. FINDINGS: CT HEAD FINDINGS Brain: Atrophy and chronic microvascular ischemic change are identified. Small prepontine mass on the left measures approximately 1 cm in diameter and is most consistent with a meningioma, unchanged. No other mass is identified. No evidence of acute infarct, hemorrhage, midline shift or abnormal extra-axial fluid collection. No hydrocephalus or pneumocephalus. Vascular: Atherosclerosis noted. Skull: Intact. Sinuses/Orbits: Mild mucosal thickening left maxillary sinus is seen. No acute abnormality. Other: None. CT CERVICAL SPINE FINDINGS Alignment: Facet mediated 0.3 cm anterolisthesis C4 on C5 is identified. Otherwise  maintained. Convex right scoliosis is noted. Skull base and vertebrae: No fracture or worrisome lesion. 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 Soft tissues and spinal canal: Carotid atherosclerosis is identified. No hematoma. No prevertebral soft tissue swelling. Disc levels: Loss of disc space height is worst at C6-7. Scattered facet degenerative change appears worst on the left at C4-5. Upper chest: Lung apices are clear. Other: None. IMPRESSION: No acute abnormality head or cervical spine. Atrophy and chronic microvascular ischemic change. No change in a small prepontine meningioma. 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 Cervical spondylosis appears worst at C5-6. Atherosclerosis. Electronically Signed   By: Inge Rise M.D.   On: 03/06/2016 17:55     Assessment/Plan Principal Problem:   Acute encephalopathy Active Problems:   Hypertensive urgency    1. Acute encephalopathy - cause not clear. Patient's wife is unable to be reached. Patient probably has a combine of dementia. Will need to get further history once family available. Since recent CT scan showed left-sided lung mass concerning for malignancy we will get an MRI of the brain with and without contrast and EEG. Ammonia levels are normal. 2. Recent CAT scan showing left lung mass. 3. Elevated blood pressure with possible hypertensive urgency - for now I have placed patient on when necessary IV hydralazine. Closely follow blood pressure trends. 4. Mild nontraumatic rhabdomyolysis - gently hydrate and recheck CK levels.  Chest x-ray this admission is pending. We'll also get B-12 levels RPR and TSH and folate levels for dementia workup.   DVT prophylaxis: SCDs. Code Status: Full code.  Family Communication: Unable to reach.  Disposition Plan: To be determined.  Consults called: Social work.  Admission status: Observation.    Rise Patience MD [facility] Pager [phone].  If 7PM-7AM, please contact  night-coverage [email or web address] Password [facility]  03/06/2016, 11:41 PM

## 2016-03-06 NOTE — ED Notes (Signed)
Bed: [facility] Expected date:  Expected time:  Means of arrival:  Comments: 

## 2016-03-06 NOTE — ED Provider Notes (Signed)
[facility] DEPT Provider Note   CSN: [id number] Arrival date & time: 03/06/16  1508     History   Chief Complaint Chief Complaint  Patient presents with  . Fall   Triage Note 90 Per EMS-states lives at home with wife-APS at residence due to hoarding-APS found patient on floor-states unwitnessed fall-unsure of LOC-no s/s's of distress-patient asleep on the stretcher-patient alert and oriented X 2-complaining of pain at back of head-history of dementia-patient is at baseline mentally    HPI Mitchell Grant is a 83 y.o. male.  The history is provided by the EMS personnel.   Remainder of history, ROS, and physical exam limited due to patient's condition (AMS). Additional information was obtained from either EMS or family.   Level V Caveat.  Pt has no complaints with me at this time.   Past Medical History:  Diagnosis Date  . Diabetes mellitus without complication ([place])   . Hyperlipidemia     Patient Active Problem List   Diagnosis Date Noted  . Lung nodule 02/15/2016    History reviewed. No pertinent surgical history.     Home Medications    Prior to Admission medications   Medication Sig Start Date End Date Taking? Authorizing Provider  Multiple Vitamin (MULTIVITAMIN WITH MINERALS) TABS tablet Take 1 tablet by mouth daily.    Historical Provider, MD  sennosides-docusate sodium (SENOKOT-S) 8.6-50 MG tablet Take 1 tablet by mouth daily.    Historical Provider, MD    Family History No family history on file.  Social History Social History  Substance Use Topics  . Smoking status: Former Research scientist (life sciences)  . Smokeless tobacco: Not on file  . Alcohol use Yes     Allergies   Patient has no known allergies.   Review of Systems Review of Systems  Unable to perform ROS: Mental status change     Physical Exam Updated Vital Signs BP 183/90   Pulse (!) 55   Temp 97.4 F (36.3 C) (Rectal)   Resp 10   SpO2 96%   Physical Exam  Constitutional: He appears  well-developed and well-nourished. No distress.  Disheveled. Underwear is soaked in urine and feces; appears to have been on for days.  HENT:  Head: Normocephalic and atraumatic.  Right Ear: External ear normal.  Left Ear: External ear normal.  Nose: Nose normal.  Mouth/Throat: Oropharynx is clear and moist. Abnormal dentition (missing several upper incisors).  Eyes: Conjunctivae and EOM are normal. Pupils are equal, round, and reactive to light. Right eye exhibits no discharge. Left eye exhibits no discharge. No scleral icterus.  Neck: Normal range of motion. Neck supple.  Cardiovascular: Normal rate, regular rhythm and normal heart sounds.  Exam reveals no gallop and no friction rub.   No murmur heard. Pulses:      Radial pulses are 2+ on the right side, and 2+ on the left side.       Dorsalis pedis pulses are 2+ on the right side, and 2+ on the left side.  Pulmonary/Chest: Effort normal and breath sounds normal. No stridor. No respiratory distress. He has no rales.  Abdominal: Soft. He exhibits no distension. There is no tenderness.  Musculoskeletal: He exhibits no edema or tenderness.       Cervical back: He exhibits no bony tenderness.       Thoracic back: He exhibits no bony tenderness.       Lumbar back: He exhibits no bony tenderness.  Clavicle stable. Chest stable to AP/Lat compression. Pelvis stable  to Lat compression. No obvious extremity deformity. No chest or abdominal wall contusion.  Neurological: He is alert. He is disoriented (oriented to self only). GCS eye subscore is 4. GCS verbal subscore is 5. GCS motor subscore is 6.  pleasantly demented. Moving all extremities   Skin: Skin is warm and dry. No rash noted. He is not diaphoretic. No erythema.  Psychiatric: He has a normal mood and affect.  Vitals reviewed.    ED Treatments / Results  Labs (all labs ordered are listed, but only abnormal results are displayed) Labs Reviewed  CBC WITH DIFFERENTIAL/PLATELET -  Abnormal; Notable for the following:       Result Value   WBC 13.4 (*)    Neutro Abs 10.9 (*)    All other components within normal limits  COMPREHENSIVE METABOLIC PANEL - Abnormal; Notable for the following:    Potassium 3.2 (*)    Glucose, Bld 120 (*)    BUN 26 (*)    AST 56 (*)    Total Bilirubin 3.0 (*)    All other components within normal limits  CK - Abnormal; Notable for the following:    Total CK 671 (*)    All other components within normal limits  AMMONIA  ETHANOL  LACTIC ACID, PLASMA  CBC WITH DIFFERENTIAL/PLATELET  URINALYSIS, ROUTINE W REFLEX MICROSCOPIC    EKG  EKG Interpretation None       Radiology Ct Head Wo Contrast  Result Date: 03/06/2016 CLINICAL DATA:  Patient found down today. EXAM: CT HEAD WITHOUT CONTRAST CT CERVICAL SPINE WITHOUT CONTRAST TECHNIQUE: Multidetector CT imaging of the head and cervical spine was performed following the standard protocol without intravenous contrast. Multiplanar CT image reconstructions of the cervical spine were also generated. COMPARISON:  Head CT scan and brain MRI 10/30/2014. FINDINGS: CT HEAD FINDINGS Brain: Atrophy and chronic microvascular ischemic change are identified. Small prepontine mass on the left measures approximately 1 cm in diameter and is most consistent with a meningioma, unchanged. No other mass is identified. No evidence of acute infarct, hemorrhage, midline shift or abnormal extra-axial fluid collection. No hydrocephalus or pneumocephalus. Vascular: Atherosclerosis noted. Skull: Intact. Sinuses/Orbits: Mild mucosal thickening left maxillary sinus is seen. No acute abnormality. Other: None. CT CERVICAL SPINE FINDINGS Alignment: Facet mediated 0.3 cm anterolisthesis C4 on C5 is identified. Otherwise maintained. Convex right scoliosis is noted. Skull base and vertebrae: No fracture or worrisome lesion. 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 Soft tissues and spinal canal: Carotid atherosclerosis is identified. No  hematoma. No prevertebral soft tissue swelling. Disc levels: Loss of disc space height is worst at C6-7. Scattered facet degenerative change appears worst on the left at C4-5. Upper chest: Lung apices are clear. Other: None. IMPRESSION: No acute abnormality head or cervical spine. Atrophy and chronic microvascular ischemic change. No change in a small prepontine meningioma. 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 Cervical spondylosis appears worst at C5-6. Atherosclerosis. Electronically Signed   By: Inge Rise M.D.   On: 03/06/2016 17:55   Ct Cervical Spine Wo Contrast  Result Date: 03/06/2016 CLINICAL DATA:  Patient found down today. EXAM: CT HEAD WITHOUT CONTRAST CT CERVICAL SPINE WITHOUT CONTRAST TECHNIQUE: Multidetector CT imaging of the head and cervical spine was performed following the standard protocol without intravenous contrast. Multiplanar CT image reconstructions of the cervical spine were also generated. COMPARISON:  Head CT scan and brain MRI 10/30/2014. FINDINGS: CT HEAD FINDINGS Brain: Atrophy and chronic microvascular ischemic change are identified. Small prepontine mass on the left measures approximately  1 cm in diameter and is most consistent with a meningioma, unchanged. No other mass is identified. No evidence of acute infarct, hemorrhage, midline shift or abnormal extra-axial fluid collection. No hydrocephalus or pneumocephalus. Vascular: Atherosclerosis noted. Skull: Intact. Sinuses/Orbits: Mild mucosal thickening left maxillary sinus is seen. No acute abnormality. Other: None. CT CERVICAL SPINE FINDINGS Alignment: Facet mediated 0.3 cm anterolisthesis C4 on C5 is identified. Otherwise maintained. Convex right scoliosis is noted. Skull base and vertebrae: No fracture or worrisome lesion. 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 Soft tissues and spinal canal: Carotid atherosclerosis is identified. No hematoma. No prevertebral soft tissue swelling. Disc levels: Loss of disc space height is worst at C6-7.  Scattered facet degenerative change appears worst on the left at C4-5. Upper chest: Lung apices are clear. Other: None. IMPRESSION: No acute abnormality head or cervical spine. Atrophy and chronic microvascular ischemic change. No change in a small prepontine meningioma. 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 Cervical spondylosis appears worst at C5-6. Atherosclerosis. Electronically Signed   By: Inge Rise M.D.   On: 03/06/2016 17:55    Procedures Procedures (including critical care time)  Medications Ordered in ED Medications  sodium chloride 0.9 % bolus 1,000 mL (0 mLs Intravenous Stopped 03/06/16 2027)     Initial Impression / Assessment and Plan / ED Course  I have reviewed the triage vital signs and the nursing notes.  Pertinent labs & imaging results that were available during my care of the patient were reviewed by me and considered in my medical decision making (see chart for details).  Clinical Course as of Mar 06 2225  Mon Mar 06, 2016  2706 Attempted to contact pt's wife Siri Cole) at [phone], but unable to reach anyone. Given the report, I am concerned that the wife may also be unable to get around the house and there may be health issues related to this. Will touch base with [employer] to see if they are able to check the home. No other contacts noted.   [PC]    Clinical Course User Index [PC] Fatima Blank, MD    Workup without source of altered mental status. No infectious symptoms or findings; however UA still pending. Attempt to in and out of the patient was unsuccessful. Patient given additional IV fluid boluses and condom cath placed. CT head and cervical spine negative. Patient does have elevated CK likely secondary to prolonged downtime. Normal lactic acid. Normal renal function.   Unable to contact patient's wife to obtain a baseline mental status. Patient will be admitted for further workup of a presume altered mental status.  Final Clinical Impressions(s) / ED Diagnoses    Final diagnoses:  Disorientation  Non-traumatic rhabdomyolysis      Fatima Blank, MD 03/06/16 2230

## 2016-03-07 ENCOUNTER — Observation Stay (HOSPITAL_BASED_OUTPATIENT_CLINIC_OR_DEPARTMENT_OTHER)
Admit: 2016-03-07 | Discharge: 2016-03-07 | Disposition: A | Discharge status: Home / Self Care | Payer: Medicare Other | Attending: Internal Medicine | Admitting: Internal Medicine

## 2016-03-07 ENCOUNTER — Observation Stay (HOSPITAL_COMMUNITY): Payer: Medicare Other

## 2016-03-07 DIAGNOSIS — R627 Adult failure to thrive: Secondary | ICD-10-CM | POA: Diagnosis not present

## 2016-03-07 DIAGNOSIS — G9340 Encephalopathy, unspecified: Secondary | ICD-10-CM | POA: Diagnosis not present

## 2016-03-07 DIAGNOSIS — E860 Dehydration: Secondary | ICD-10-CM | POA: Diagnosis present

## 2016-03-07 DIAGNOSIS — Z79899 Other long term (current) drug therapy: Secondary | ICD-10-CM | POA: Diagnosis not present

## 2016-03-07 DIAGNOSIS — R748 Abnormal levels of other serum enzymes: Secondary | ICD-10-CM | POA: Diagnosis not present

## 2016-03-07 DIAGNOSIS — N179 Acute kidney failure, unspecified: Secondary | ICD-10-CM | POA: Diagnosis not present

## 2016-03-07 DIAGNOSIS — G3183 Dementia with Lewy bodies: Secondary | ICD-10-CM | POA: Diagnosis not present

## 2016-03-07 DIAGNOSIS — Z8673 Personal history of transient ischemic attack (TIA), and cerebral infarction without residual deficits: Secondary | ICD-10-CM | POA: Diagnosis not present

## 2016-03-07 DIAGNOSIS — G9341 Metabolic encephalopathy: Secondary | ICD-10-CM | POA: Diagnosis present

## 2016-03-07 DIAGNOSIS — F0391 Unspecified dementia with behavioral disturbance: Secondary | ICD-10-CM | POA: Diagnosis present

## 2016-03-07 DIAGNOSIS — Z515 Encounter for palliative care: Secondary | ICD-10-CM | POA: Diagnosis present

## 2016-03-07 DIAGNOSIS — I160 Hypertensive urgency: Secondary | ICD-10-CM | POA: Diagnosis present

## 2016-03-07 DIAGNOSIS — R222 Localized swelling, mass and lump, trunk: Secondary | ICD-10-CM | POA: Diagnosis not present

## 2016-03-07 DIAGNOSIS — E785 Hyperlipidemia, unspecified: Secondary | ICD-10-CM | POA: Diagnosis present

## 2016-03-07 DIAGNOSIS — Z66 Do not resuscitate: Secondary | ICD-10-CM | POA: Diagnosis present

## 2016-03-07 DIAGNOSIS — C3432 Malignant neoplasm of lower lobe, left bronchus or lung: Secondary | ICD-10-CM | POA: Diagnosis present

## 2016-03-07 DIAGNOSIS — Z87891 Personal history of nicotine dependence: Secondary | ICD-10-CM | POA: Diagnosis not present

## 2016-03-07 DIAGNOSIS — R410 Disorientation, unspecified: Secondary | ICD-10-CM | POA: Diagnosis present

## 2016-03-07 DIAGNOSIS — D72829 Elevated white blood cell count, unspecified: Secondary | ICD-10-CM | POA: Diagnosis present

## 2016-03-07 DIAGNOSIS — R918 Other nonspecific abnormal finding of lung field: Secondary | ICD-10-CM | POA: Diagnosis not present

## 2016-03-07 DIAGNOSIS — I10 Essential (primary) hypertension: Secondary | ICD-10-CM | POA: Diagnosis present

## 2016-03-07 DIAGNOSIS — E119 Type 2 diabetes mellitus without complications: Secondary | ICD-10-CM | POA: Diagnosis present

## 2016-03-07 DIAGNOSIS — E869 Volume depletion, unspecified: Secondary | ICD-10-CM | POA: Diagnosis present

## 2016-03-07 DIAGNOSIS — M6282 Rhabdomyolysis: Secondary | ICD-10-CM | POA: Diagnosis not present

## 2016-03-07 DIAGNOSIS — F0281 Dementia in other diseases classified elsewhere with behavioral disturbance: Secondary | ICD-10-CM | POA: Diagnosis not present

## 2016-03-07 DIAGNOSIS — E876 Hypokalemia: Secondary | ICD-10-CM | POA: Diagnosis present

## 2016-03-07 DIAGNOSIS — W19XXXA Unspecified fall, initial encounter: Secondary | ICD-10-CM | POA: Diagnosis present

## 2016-03-07 LAB — CK: CK TOTAL: 681 U/L — AB (ref 49–397)

## 2016-03-07 LAB — HEPATIC FUNCTION PANEL
ALT: 28 U/L (ref 17–63)
AST: 53 U/L — ABNORMAL HIGH (ref 15–41)
Albumin: 3.6 g/dL (ref 3.5–5.0)
Alkaline Phosphatase: 44 U/L (ref 38–126)
BILIRUBIN DIRECT: 0.5 mg/dL (ref 0.1–0.5)
BILIRUBIN INDIRECT: 1.8 mg/dL — AB (ref 0.3–0.9)
Total Bilirubin: 2.3 mg/dL — ABNORMAL HIGH (ref 0.3–1.2)
Total Protein: 6.4 g/dL — ABNORMAL LOW (ref 6.5–8.1)

## 2016-03-07 LAB — BASIC METABOLIC PANEL
ANION GAP: 9 (ref 5–15)
BUN: 21 mg/dL — ABNORMAL HIGH (ref 6–20)
CHLORIDE: 109 mmol/L (ref 101–111)
CO2: 27 mmol/L (ref 22–32)
Calcium: 9 mg/dL (ref 8.9–10.3)
Creatinine, Ser: 0.8 mg/dL (ref 0.61–1.24)
GFR calc Af Amer: >60 mL/min (ref >60)
Glucose, Bld: 97 mg/dL (ref 65–99)
POTASSIUM: 2.9 mmol/L — AB (ref 3.5–5.1)
SODIUM: 145 mmol/L (ref 135–145)

## 2016-03-07 LAB — HIV ANTIBODY (ROUTINE TESTING W REFLEX): HIV Screen 4th Generation wRfx: NONREACTIVE

## 2016-03-07 LAB — CBC
HEMATOCRIT: 41.1 % (ref 39.0–52.0)
HEMOGLOBIN: 14.3 g/dL (ref 13.0–17.0)
MCH: 29.8 pg (ref 26.0–34.0)
MCHC: 34.8 g/dL (ref 30.0–36.0)
MCV: 85.6 fL (ref 78.0–100.0)
Platelets: 167 10*3/uL (ref 150–400)
RBC: 4.8 MIL/uL (ref 4.22–5.81)
RDW: 13.8 % (ref 11.5–15.5)
WBC: 10.9 10*3/uL — AB (ref 4.0–10.5)

## 2016-03-07 LAB — RAPID URINE DRUG SCREEN, HOSP PERFORMED
Amphetamines: NOT DETECTED
Barbiturates: NOT DETECTED
Benzodiazepines: NOT DETECTED
Cocaine: NOT DETECTED
Opiates: NOT DETECTED
Tetrahydrocannabinol: NOT DETECTED

## 2016-03-07 LAB — TSH: TSH: 1.329 u[IU]/mL (ref 0.350–4.500)

## 2016-03-07 LAB — SALICYLATE LEVEL

## 2016-03-07 LAB — MAGNESIUM: MAGNESIUM: 2.2 mg/dL (ref 1.7–2.4)

## 2016-03-07 LAB — ACETAMINOPHEN LEVEL: Acetaminophen (Tylenol), Serum: <10 ug/mL — ABNORMAL LOW (ref 10–30)

## 2016-03-07 MED ORDER — SODIUM CHLORIDE 0.9 % IV SOLN
30.0000 meq | Freq: Once | INTRAVENOUS | Status: DC
  Filled 2016-03-07: qty 15

## 2016-03-07 MED ORDER — POTASSIUM CHLORIDE IN NACL 40-0.9 MEQ/L-% IV SOLN
INTRAVENOUS | Status: DC
  Administered 2016-03-07 – 2016-03-09 (×5): 100 mL/h via INTRAVENOUS
  Filled 2016-03-07 (×7): qty 1000

## 2016-03-07 MED ORDER — ENSURE ENLIVE PO LIQD
237.0000 mL | Freq: Two times a day (BID) | ORAL | Status: DC
  Administered 2016-03-08: 237 mL via ORAL

## 2016-03-07 MED ORDER — CYANOCOBALAMIN 1000 MCG/ML IJ SOLN
1000.0000 ug | Freq: Once | INTRAMUSCULAR | Status: AC
  Administered 2016-03-07: 1000 ug via INTRAMUSCULAR
  Filled 2016-03-07: qty 1

## 2016-03-07 NOTE — Clinical Social Work Note (Deleted)
Clinical Social Work Assessment  Patient Details  Name: Mitchell Grant MRN: [id number] Date of Birth: 10-27-1930  Date of referral:  03/07/16               Reason for consult:  Facility Placement                Permission sought to share information with:  Chartered certified accountant granted to share information::  Yes, Verbal Permission Granted  Name::        Agency::     Relationship::     Contact Information:     Housing/Transportation Living arrangements for the past 2 months:  Single Family Home Source of Information:  Patient, Adult Children Patient Interpreter Needed:  None Criminal Activity/Legal Involvement Pertinent to Current Situation/Hospitalization:  No - Comment as needed Significant Relationships:  Adult Children Lives with:  Self Do you feel safe going back to the place where you live?  No Need for family participation in patient care:  Yes (Comment)  Care giving concerns:  CSW received consult for SNF placement.    Social Worker assessment / plan:  CSW spoke with patient & son, Ronaldo at bedside re: discharge planning. Son expressed interest in [facility] SNF. CSW confirmed with Santiago Glad at [facility] that they would be able to offer a bed.   Employment status:    Insurance information:  Medicare PT Recommendations:  [place] / Referral to community resources:  Ali Molina  Patient/Family's Response to care:    Patient/Family's Understanding of and Emotional Response to Diagnosis, Current Treatment, and Prognosis:    Emotional Assessment Appearance:    Attitude/Demeanor/Rapport:    Affect (typically observed):    Orientation:  Oriented to Self, Oriented to Place, Oriented to  Time, Oriented to Situation Alcohol / Substance use:    Psych involvement (Current and /or in the community):     Discharge Needs  Concerns to be addressed:    Readmission within the last 30 days:     Current discharge risk:    Barriers to Discharge:      Standley Brooking, LCSW 03/07/2016, 3:54 PM

## 2016-03-07 NOTE — Progress Notes (Signed)
Spoke with [employer] from [facility] APS concerning pt at bedside. APS request form placed on chart for release of information. Copy of H&P given to [employer]. CSW following up with APS.

## 2016-03-07 NOTE — Clinical Social Work Placement (Signed)
   CLINICAL SOCIAL WORK PLACEMENT  NOTE  Date:  03/07/2016  Patient Details  Name: Mitchell Grant MRN: [id number] Date of Birth: 1930-11-27  Clinical Social Work is seeking post-discharge placement for this patient at the [place] level of care (*CSW will initial, date and re-position this form in  chart as items are completed):  Yes   Patient/family provided with [facility] Work Department's list of facilities offering this level of care within the geographic area requested by the patient (or if unable, by the patient's family).  Yes   Patient/family informed of their freedom to choose among providers that offer the needed level of care, that participate in Medicare, Medicaid or managed care program needed by the patient, have an available bed and are willing to accept the patient.  Yes   Patient/family informed of [facility]'s ownership interest in [facility] and [facility], as well as of the fact that they are under no obligation to receive care at these facilities.  PASRR submitted to EDS on 03/07/16     PASRR number received on 03/07/16     Existing PASRR number confirmed on       FL2 transmitted to all facilities in geographic area requested by pt/family on 03/07/16     FL2 transmitted to all facilities within larger geographic area on       Patient informed that his/her managed care company has contracts with or will negotiate with certain facilities, including the following:            Patient/family informed of bed offers received.  Patient chooses bed at       Physician recommends and patient chooses bed at      Patient to be transferred to   on  .  Patient to be transferred to facility by       Patient family notified on   of transfer.  Name of family member notified:        PHYSICIAN       Additional Comment:    _______________________________________________ Standley Brooking, LCSW 03/07/2016, 2:05 PM

## 2016-03-07 NOTE — Progress Notes (Signed)
EEG Completed; Results Pending  

## 2016-03-07 NOTE — Procedures (Signed)
ELECTROENCEPHALOGRAM REPORT  Date of Study: 03/07/2016  Patient's Name: Mitchell Grant MRN: [id number] Date of Birth: [birth year]  Referring Provider: Dr. Gean Birchwood  Clinical History: This is an 83 year old man with altered mental status.  Medications: acetaminophen (TYLENOL) tablet 650 mg  hydrALAZINE (APRESOLINE) injection 10 mg  ondansetron (ZOFRAN) injection 4 mg  Technical Summary: A multichannel digital EEG recording measured by the international 10-20 system with electrodes applied with paste and impedances below 5000 ohms performed as portable with EKG monitoring in an awake and drowsy patient.  Hyperventilation and photic stimulation were not performed.  The digital EEG was referentially recorded, reformatted, and digitally filtered in a variety of bipolar and referential montages for optimal display.   Description: The patient is awake and drowsy during the recording. He is confused and agitated. There is no clear posterior dominant rhythm. The background consists of a large amount of diffuse 4-6 Hz theta and 2-3 Hz delta slowing that increases with drowsiness. Normal sleep architecture was not seen. Hyperventilation and photic stimulation were not performed.  There were no epileptiform discharges or electrographic seizures seen.    EKG lead was unremarkable.  Impression: This awake and drowsy EEG is abnormal due to moderate diffuse slowing of the waking background.  Clinical Correlation of the above findings indicates diffuse cerebral dysfunction that is non-specific in etiology and can be seen with hypoxic/ischemic injury, toxic/metabolic encephalopathies, neurodegenerative disorders, or medication effect.  The absence of epileptiform discharges does not rule out a clinical diagnosis of epilepsy.  Clinical correlation is advised.   Ellouise Newer, M.D.

## 2016-03-07 NOTE — Clinical Social Work Note (Signed)
Clinical Social Work Assessment  Patient Details  Name: Mitchell Grant MRN: [id number] Date of Birth: [birth year]  Date of referral:  03/07/16               Reason for consult:  Facility Placement                Permission sought to share information with:  Chartered certified accountant granted to share information::  Yes, Verbal Permission Granted  Name::        Agency::     Relationship::     Contact Information:     Housing/Transportation Living arrangements for the past 2 months:  Single Family Home Source of Information:  Patient, Other (Comment Required) (APS worker, Web designer) Patient Interpreter Needed:  None Criminal Activity/Legal Involvement Pertinent to Current Situation/Hospitalization:  No - Comment as needed Significant Relationships:  Spouse Lives with:  Spouse Do you feel safe going back to the place where you live?  No Need for family participation in patient care:  Yes (Comment)  Care giving concerns:  CSW received consult for SNF placement.    Social Worker assessment / plan:  CSW met with patient's APS worker - Teacher, music re: discharge planning. Patient was switched from observation to inpatient, and if patient is able to stay until Friday - would have a 3 night qualifying hospital stay where Medicare will cover SNF placement. CSW sent information out for bed offers & will follow-up tomorrow afternoon with SNF bed offers.   Employment status:    Insurance information:  Medicare PT Recommendations:  [place] / Referral to community resources:  [place]  Patient/Family's Response to care:    Patient/Family's Understanding of and Emotional Response to Diagnosis, Current Treatment, and Prognosis:    Emotional Assessment Appearance:    Attitude/Demeanor/Rapport:    Affect (typically observed):    Orientation:  Oriented to Self, Oriented to  Time, Oriented to Place Alcohol / Substance use:    Psych  involvement (Current and /or in the community):     Discharge Needs  Concerns to be addressed:    Readmission within the last 30 days:    Current discharge risk:    Barriers to Discharge:      Standley Brooking, LCSW 03/07/2016, 4:23 PM

## 2016-03-07 NOTE — NC FL2 (Signed)
  [place] LEVEL OF CARE SCREENING TOOL     IDENTIFICATION  Patient Name: Mitchell Grant Birthdate: 1930/02/07 Sex: male Admission Date (Current Location): 03/06/2016  [facility] and [place] Number:  Herbalist and Address:  [facility],  [place] [address], [place]      Provider Number: [id number]  Attending Physician Name and Address:  Nita Sells, MD  Relative Name and Phone Number:       Current Level of Care: Hospital Recommended Level of Care: [place] Prior Approval Number:    Date Approved/Denied:   PASRR Number: [id number]  Discharge Plan: SNF    Current Diagnoses: Patient Active Problem List   Diagnosis Date Noted  . Acute encephalopathy 03/06/2016  . Hypertensive urgency 03/06/2016  . Non-traumatic rhabdomyolysis   . Lung nodule 02/15/2016    Orientation RESPIRATION BLADDER Height & Weight     Self  Normal Incontinent, External catheter Weight: 140 lb 10.5 oz (63.8 kg) Height:  '5\' 4"'$  (162.6 cm)  BEHAVIORAL SYMPTOMS/MOOD NEUROLOGICAL BOWEL NUTRITION STATUS      Incontinent Diet (Heart)  AMBULATORY STATUS COMMUNICATION OF NEEDS Skin   Extensive Assist Verbally Normal                       Personal Care Assistance Level of Assistance  Bathing, Dressing Bathing Assistance: Limited assistance   Dressing Assistance: Limited assistance     Functional Limitations Info             SPECIAL CARE FACTORS FREQUENCY  PT (By licensed PT), OT (By licensed OT)     PT Frequency: 5 OT Frequency: 5            Contractures      Additional Factors Info  Code Status, Allergies Code Status Info: Fullcode Allergies Info: NKDA           Current Medications (03/07/2016):  This is the current hospital active medication list Current Facility-Administered Medications  Medication Dose Route Frequency Provider Last Rate Last Dose  . 0.9 % NaCl with KCl 40 mEq / L  infusion    Intravenous Continuous Nita Sells, MD 100 mL/hr at 03/07/16 0954 100 mL/hr at 03/07/16 0954  . acetaminophen (TYLENOL) tablet 650 mg  650 mg Oral Q6H PRN Rise Patience, MD       Or  . acetaminophen (TYLENOL) suppository 650 mg  650 mg Rectal Q6H PRN Rise Patience, MD      . feeding supplement (ENSURE ENLIVE) (ENSURE ENLIVE) liquid 237 mL  237 mL Oral BID BM Nita Sells, MD      . hydrALAZINE (APRESOLINE) injection 10 mg  10 mg Intravenous Q4H PRN Rise Patience, MD   10 mg at 03/06/16 2337  . ondansetron (ZOFRAN) tablet 4 mg  4 mg Oral Q6H PRN Rise Patience, MD       Or  . ondansetron [facility]) injection 4 mg  4 mg Intravenous Q6H PRN Rise Patience, MD         Discharge Medications: Please see discharge summary for a list of discharge medications.  Relevant Imaging Results:  Relevant Lab Results:   Additional Information SSN: [id number]  Standley Brooking, LCSW

## 2016-03-07 NOTE — Clinical Social Work Placement (Deleted)
Patient has a bed at [facility] CSW has completed FL2 & will continue to follow and assist with discharge when ready.    [name], [facility] Clinical Social Worker cell #: [phone]     CLINICAL SOCIAL WORK PLACEMENT  NOTE  Date:  03/07/2016  Patient Details  Name: Mitchell Grant MRN: [id number] Date of Birth: 1930/02/03  Clinical Social Work is seeking post-discharge placement for this patient at the [place] level of care (*CSW will initial, date and re-position this form in  chart as items are completed):  Yes   Patient/family provided with [place] Work Department's list of facilities offering this level of care within the geographic area requested by the patient (or if unable, by the patient's family).  Yes   Patient/family informed of their freedom to choose among providers that offer the needed level of care, that participate in Medicare, Medicaid or managed care program needed by the patient, have an available bed and are willing to accept the patient.  Yes   Patient/family informed of [facility]'s ownership interest in [facility] and [facility], as well as of the fact that they are under no obligation to receive care at these facilities.  PASRR submitted to EDS on 03/07/16     PASRR number received on 03/07/16     Existing PASRR number confirmed on       FL2 transmitted to all facilities in geographic area requested by pt/family on 03/07/16     FL2 transmitted to all facilities within larger geographic area on       Patient informed that his/her managed care company has contracts with or will negotiate with certain facilities, including the following:        Yes   Patient/family informed of bed offers received.  Patient chooses bed at [facility]     Physician recommends and patient chooses bed at      Patient to be transferred to [facility] on   .  Patient to be transferred to facility by       Patient family notified on   of transfer.  Name of family member notified:        PHYSICIAN       Additional Comment:    _______________________________________________ Standley Brooking, LCSW 03/07/2016, 3:56 PM

## 2016-03-07 NOTE — Progress Notes (Signed)
If family comes in to visit patient, please have them contact MRI for MRI screening.

## 2016-03-07 NOTE — Progress Notes (Signed)
PROGRESS NOTE    Mitchell Grant  NLG:[id number] DOB: 1930/07/06 DOA: 03/06/2016 PCP: No PCP Per Patient  Outpatient Specialists:     Brief Narrative:  83  y/o ? 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   Assessment & Plan:   Principal Problem:   Acute encephalopathy Active Problems:   Hypertensive urgency   Metabolic encephalopathy superimposed on dementia-unclear etiology. mild volume depletion. Hydrate with IV saline 75 cc/HR-follow-up B12 and RPR and HIV ordered by admitting physician. 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 Social worker to follow-up with family Severe hypokalemia-potassium 2.9. Replace with runs of potassium change to IV saline to include 40 of K. Leukocytosis etiology unclear -no infectious source suspected trend WBC.  Would not start antibiotics at this time Mildhyper bilirubinemia-repeat LFTs show bilirubin 2.3 down from 3 on admission Mildly elevated CK total 670 on admission-probably secondary to the underground. Monitor in a.m., repeat went from [phone] Patient under care of Adult Protective Services-social worker to be involved and will investigate. Make sure safe line for discharge   Lovenox Might need APS or  placement?  Await Eval PT OT Called to discuss with spouse-no answer on phone, no abilaity to leave VM   Consultants:     Procedures:     Antimicrobials:       Subjective: alert but confused In nad Pulling at IV and asking to be let out of restraints  Objective: Vitals:   03/06/16 2303 03/06/16 2330 03/07/16 0010 03/07/16 0634  BP: (!) 212/98 (!) 165/143 (!) 118/54 (!) 150/96  Pulse: 70  72 82  Resp: '13 17 16 16  '$ Temp:   98.1 F (36.7 C) 98 F (36.7 C)  TempSrc:   Oral Oral  SpO2: 97%  98% 98%  Weight:   63.8 kg (140 lb 10.5 oz)   Height:   '5\' 4"'$  (1.626 m)     Intake/Output Summary (Last 24 hours) at 03/07/16 0750 Last data filed at 03/07/16 0600  Gross per 24 hour  Intake          1398.75 ml  Output                0 ml  Net          1398.75 ml   Filed Weights   03/07/16 0010  Weight: 63.8 kg (140 lb 10.5 oz)    Examination:  General exam: Appears calm  Respiratory system: Clear to auscultation anteriorly, poor exam posteriorly Cardiovascular system: S1 & S2 heard, RRR. No JVD, murmurs, rubs, gallops or clicks. No pedal edema. Gastrointestinal system: Abdomen is non tedner Confused Moves 4 limbs equally In restraints   Data Reviewed: I have personally reviewed following labs and imaging studies  CBC:  Recent Labs Lab 03/06/16 1714 03/07/16 0537  WBC 13.4* 10.9*  NEUTROABS 10.9*  --   HGB 15.9 14.3  HCT 44.5 41.1  MCV 85.2 85.6  PLT 164 144   Basic Metabolic Panel:  Recent Labs Lab 03/06/16 1714 03/07/16 0537  NA 145 145  K 3.2* 2.9*  CL 105 109  CO2 27 27  GLUCOSE 120* 97  BUN 26* 21*  CREATININE 0.85 0.80  CALCIUM 9.7 9.0   GFR: Estimated Creatinine Clearance: 58.6 mL/min (by C-G formula based on SCr of 0.8 mg/dL). Liver Function Tests:  Recent Labs Lab 03/06/16 1714  AST 56*  ALT 44  ALKPHOS 52  BILITOT 3.0*  PROT 7.8  ALBUMIN 4.2   No results for input(s): LIPASE, AMYLASE in the last 168 hours.  Recent  Labs Lab 03/06/16 1827  AMMONIA 12   Coagulation Profile: No results for input(s): INR, PROTIME in the last 168 hours. Cardiac Enzymes:  Recent Labs Lab 03/06/16 1714  CKTOTAL 671*   BNP (last 3 results) No results for input(s): PROBNP in the last 8760 hours. HbA1C: No results for input(s): HGBA1C in the last 72 hours. CBG: No results for input(s): GLUCAP in the last 168 hours. Lipid Profile: No results for input(s): CHOL, HDL, LDLCALC, TRIG, CHOLHDL, LDLDIRECT in the last 72 hours. Thyroid Function Tests:  Recent Labs  03/07/16 0537  TSH 1.329   Anemia Panel: No results for input(s): VITAMINB12, FOLATE, FERRITIN, TIBC, IRON, RETICCTPCT in the last 72 hours. Urine analysis:    Component Value Date/Time   COLORURINE AMBER (A) 03/06/2016 2243   APPEARANCEUR CLEAR 03/06/2016 2243   LABSPEC 1.024 03/06/2016 2243   PHURINE 5.0 03/06/2016 2243   GLUCOSEU NEGATIVE 03/06/2016 2243   HGBUR SMALL (A) 03/06/2016 2243   BILIRUBINUR NEGATIVE 03/06/2016 2243   KETONESUR 20 (A) 03/06/2016 2243   PROTEINUR NEGATIVE 03/06/2016 2243   UROBILINOGEN 0.2 10/30/2014 1348   NITRITE NEGATIVE 03/06/2016 2243   LEUKOCYTESUR NEGATIVE 03/06/2016 2243   Sepsis Labs: '@LABRCNTIP'$ (procalcitonin:4,lacticidven:4)  )No results found for this or any previous visit (from the past 240 hour(s)).       Radiology Studies: Ct Head Wo Contrast  Result Date: 03/06/2016 CLINICAL DATA:  Patient found down today. EXAM: CT HEAD WITHOUT CONTRAST CT CERVICAL SPINE WITHOUT CONTRAST TECHNIQUE: Multidetector CT imaging of the head and cervical spine was performed following the standard protocol without intravenous contrast. Multiplanar CT image reconstructions of the cervical spine were also generated. COMPARISON:  Head CT scan and brain MRI 10/30/2014. FINDINGS: CT HEAD FINDINGS Brain: Atrophy and chronic microvascular ischemic change are identified. Small prepontine mass on the left measures approximately 1  cm in diameter and is most consistent with a meningioma, unchanged. No other mass is identified. No evidence of acute infarct, hemorrhage, midline shift or abnormal extra-axial fluid collection. No hydrocephalus or pneumocephalus. Vascular: Atherosclerosis noted. Skull: Intact. Sinuses/Orbits: Mild mucosal thickening left maxillary sinus is seen. No acute abnormality. Other: None. CT CERVICAL SPINE FINDINGS Alignment: Facet mediated 0.3 cm anterolisthesis C4 on C5 is identified. Otherwise maintained. Convex right scoliosis is noted. Skull base and vertebrae: No fracture or worrisome lesion. 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 Soft tissues and spinal canal: Carotid atherosclerosis is identified. No hematoma. No prevertebral soft tissue swelling. Disc levels: Loss of disc space height is worst at C6-7. Scattered facet degenerative change appears worst on the left at C4-5. Upper chest: Lung apices are clear. Other: None. IMPRESSION: No acute abnormality head or cervical spine. Atrophy and chronic microvascular ischemic change. No change in a  small prepontine meningioma. 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 Cervical spondylosis appears worst at C5-6. Atherosclerosis. Electronically Signed   By: Inge Rise M.D.   On: 03/06/2016 17:55   Ct Cervical Spine Wo Contrast  Result Date: 03/06/2016 CLINICAL DATA:  Patient found down today. EXAM: CT HEAD WITHOUT CONTRAST CT CERVICAL SPINE WITHOUT CONTRAST TECHNIQUE: Multidetector CT imaging of the head and cervical spine was performed following the standard protocol without intravenous contrast. Multiplanar CT image reconstructions of the cervical spine were also generated. COMPARISON:  Head CT scan and brain MRI 10/30/2014. FINDINGS: CT HEAD FINDINGS Brain: Atrophy and chronic microvascular ischemic change are identified. Small prepontine mass on the left measures approximately 1 cm in diameter and is most consistent with a meningioma, unchanged. No other mass is identified. No  evidence of acute infarct, hemorrhage, midline shift or abnormal extra-axial fluid collection. No hydrocephalus or pneumocephalus. Vascular: Atherosclerosis noted. Skull: Intact. Sinuses/Orbits: Mild mucosal thickening left maxillary sinus is seen. No acute abnormality. Other: None. CT CERVICAL SPINE FINDINGS Alignment: Facet mediated 0.3 cm anterolisthesis C4 on C5 is identified. Otherwise maintained. Convex right scoliosis is noted. Skull base and vertebrae: No fracture or worrisome lesion. 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 Soft tissues and spinal canal: Carotid atherosclerosis is identified. No hematoma. No prevertebral soft tissue swelling. Disc levels: Loss of disc space height is worst at C6-7. Scattered facet degenerative change appears worst on the left at C4-5. Upper chest: Lung apices are clear. Other: None. IMPRESSION: No acute abnormality head or cervical spine. Atrophy and chronic microvascular ischemic change. No change in a small prepontine meningioma. 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 Cervical spondylosis appears worst at C5-6. Atherosclerosis. Electronically Signed   By: Inge Rise M.D.   On: 03/06/2016 17:55        Scheduled Meds: . cyanocobalamin  1,000 mcg Intramuscular Once  . potassium chloride (KCL MULTIRUN) 30 mEq in 265 mL IVPB  30 mEq Intravenous Once   Continuous Infusions: . sodium chloride 75 mL/hr at 03/07/16 0041     LOS: 0 days    Time spent: [place], MD [facility]   If 7PM-7AM, please contact night-coverage [email or web address] Password [email or web address] 03/07/2016, 7:50 AM

## 2016-03-08 DIAGNOSIS — G3183 Dementia with Lewy bodies: Secondary | ICD-10-CM

## 2016-03-08 DIAGNOSIS — F0281 Dementia in other diseases classified elsewhere with behavioral disturbance: Secondary | ICD-10-CM

## 2016-03-08 DIAGNOSIS — E860 Dehydration: Secondary | ICD-10-CM

## 2016-03-08 DIAGNOSIS — R748 Abnormal levels of other serum enzymes: Secondary | ICD-10-CM

## 2016-03-08 DIAGNOSIS — G9340 Encephalopathy, unspecified: Secondary | ICD-10-CM

## 2016-03-08 DIAGNOSIS — N179 Acute kidney failure, unspecified: Secondary | ICD-10-CM

## 2016-03-08 LAB — FOLATE RBC
FOLATE, HEMOLYSATE: 539.9 ng/mL
Folate, RBC: 1314 (ref >498)
HEMATOCRIT: 41.1 % (ref 37.5–51.0)

## 2016-03-08 LAB — RPR, QUANT+TP ABS (REFLEX): T Pallidum Abs: POSITIVE — AB

## 2016-03-08 LAB — BASIC METABOLIC PANEL
Anion gap: 7 (ref 5–15)
BUN: 27 mg/dL — ABNORMAL HIGH (ref 6–20)
CO2: 25 mmol/L (ref 22–32)
Calcium: 8.8 mg/dL — ABNORMAL LOW (ref 8.9–10.3)
Chloride: 112 mmol/L — ABNORMAL HIGH (ref 101–111)
Creatinine, Ser: 1.71 mg/dL — ABNORMAL HIGH (ref 0.61–1.24)
GFR calc non Af Amer: 35 mL/min — ABNORMAL LOW (ref >60)
GFR, EST AFRICAN AMERICAN: 41 mL/min — AB (ref >60)
Glucose, Bld: 115 mg/dL — ABNORMAL HIGH (ref 65–99)
Potassium: 3.4 mmol/L — ABNORMAL LOW (ref 3.5–5.1)
Sodium: 144 mmol/L (ref 135–145)

## 2016-03-08 LAB — RPR: RPR Ser Ql: REACTIVE — AB

## 2016-03-08 LAB — MAGNESIUM: Magnesium: 2.1 mg/dL (ref 1.7–2.4)

## 2016-03-08 MED ORDER — ENSURE ENLIVE PO LIQD
237.0000 mL | Freq: Three times a day (TID) | ORAL | Status: DC
  Administered 2016-03-09: 237 mL via ORAL

## 2016-03-08 NOTE — Progress Notes (Signed)
PROGRESS NOTE    SUEO CULLEN  WRU:[id number] DOB: 1930/05/17 DOA: 03/06/2016 PCP: No PCP Per Patient   Brief Narrative:  83  y/o ? 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   Assessment & Plan:   Principal Problem:   Acute encephalopathy Active Problems:   Hypertensive urgency   Metabolic encephalopathy  Metabolic encephalopathy superimposed on dementia-unclear etiology.  -TSH normal and no acute abnormalities on CT -RPR weakly reactive with titers of 1:2; will check FTA-ABS for confirmation. -patient unable to elaborate if he had hx or syphilis or treatment for it in the past (which will explained positive RPR) -unsafe at home in current environment -per APS rec's, will need placement -HIV non reactive  mild volume depletion.  -continue to [facility] with IV saline 75 cc/HR-follow-up   Probable left lower lobe bronchogenic carcinoma- -very poor candidate for chemotherapy or radiation given severity of dementia.  -will involved palliative care service for advance directives and code status  Severe hypokalemia-potassium 2.9.  -Replaced with runs of potassium  -now receiving K maintenance on IVF's  Leukocytosis etiology unclear -no infectious source suspected -improved/resolved with IVF"s  Mildly elevated CK total 670 on  admission and mild AKI -probably secondary to be on the flor when found, unknown for how long -will continue IVF   Lovenox Might need APS or placement?  Await Eval PT OT  Consultants:   None  Procedures:   See below for x-ray reports   EEG: no epileptic waves appreciated.  Antimicrobials:    none    Subjective: Afebrile, no CP and no SOB. Patient taken off restrains today. Remains calmed.   Objective: Vitals:   03/08/16 1518 03/08/16 2009 03/08/16 2120 03/08/16 2259  BP: (!) 162/77 (!) 179/80 (!) 185/90 (!) 158/65  Pulse: 61 (!) 57 (!) 52   Resp: 19 20    Temp: 98.2 F (36.8 C) 98 F (36.7 C)    TempSrc: Oral Axillary    SpO2: 96% 95%    Weight:      Height:        Intake/Output Summary (Last 24 hours) at 03/08/16 2345 Last data filed at 03/08/16 2200  Gross per 24 hour  Intake             2670 ml  Output                0 ml  Net             2670 ml   Filed Weights   03/07/16 0010  Weight: 63.8 kg (140 lb 10.5 oz)    Examination:  General exam: Appears calmed; sleeping and following very simple commands. unknown baseline. Respiratory system: Clear to auscultation anteriorly, poor exam posteriorly Cardiovascular system: S1 & S2 heard, RRR. No JVD, murmurs, rubs, gallops or clicks. No pedal edema. Gastrointestinal system: Abdomen is  non tedner Neurologic: Confused, Moves 4 limbs equally, poor judgement and insight     Data Reviewed: I have personally reviewed following labs and imaging studies  CBC:  Recent Labs Lab 03/06/16 1714 03/07/16 0537 03/07/16 0823  WBC 13.4* 10.9*  --   NEUTROABS 10.9*  --   --   HGB 15.9 14.3  --   HCT 44.5 41.1 41.1  MCV 85.2 85.6  --   PLT 164 167  --    Basic Metabolic Panel:  Recent Labs Lab 03/06/16 1714 03/07/16 0537 03/08/16 0849  NA 145 145 144  K 3.2* 2.9* 3.4*  CL 105 109 112*  CO2 '27 27 25  '$ GLUCOSE 120* 97 115*  BUN 26* 21* 27*  CREATININE 0.85 0.80 1.71*  CALCIUM 9.7 9.0 8.8*  MG  --   2.2 2.1   GFR: Estimated Creatinine Clearance: 27.4 mL/min (by C-G formula based on SCr of 1.71 mg/dL (H)).   Liver Function Tests:  Recent Labs Lab 03/06/16 1714 03/07/16 0823  AST 56* 53*  ALT 44 28  ALKPHOS 52 44  BILITOT 3.0* 2.3*  PROT 7.8 6.4*  ALBUMIN 4.2 3.6   No results for input(s): LIPASE, AMYLASE in the last 168 hours.  Recent Labs Lab 03/06/16 1827  AMMONIA 12   Coagulation Profile: No results for input(s): INR, PROTIME in the last 168 hours.   Cardiac Enzymes:  Recent Labs Lab 03/06/16 1714 03/07/16 0537  CKTOTAL 671* 681*   Thyroid Function Tests:  Recent Labs  03/07/16 0537  TSH 1.329   Urine analysis:    Component Value Date/Time   COLORURINE AMBER (A) 03/06/2016 2243   APPEARANCEUR CLEAR 03/06/2016 2243   LABSPEC 1.024 03/06/2016 2243   PHURINE 5.0 03/06/2016 2243   GLUCOSEU NEGATIVE 03/06/2016 2243   HGBUR SMALL (A) 03/06/2016 2243   BILIRUBINUR NEGATIVE 03/06/2016 2243   KETONESUR 20 (A) 03/06/2016 2243   PROTEINUR NEGATIVE 03/06/2016 2243   UROBILINOGEN 0.2 10/30/2014 1348   NITRITE NEGATIVE 03/06/2016 2243   LEUKOCYTESUR NEGATIVE 03/06/2016 2243     Radiology Studies: Dg Pelvis Portable  Result Date: 03/07/2016 CLINICAL DATA:  Fall, confusion EXAM: PORTABLE PELVIS 1-2 VIEWS COMPARISON:  None. FINDINGS: Single frontal view of the pelvis submitted. No acute fracture or subluxation. Mild degenerative changes are noted bilateral hip joints with mild narrowing superior joint space and mild superior acetabular spurring. Bilateral hip joints are symmetrical in appearance. Pubic symphysis is unremarkable IMPRESSION: No acute fracture or subluxation. Mild degenerative changes bilateral hip joints. Electronically Signed   By: Lahoma Crocker M.D.   On: 03/07/2016 08:58   Dg Chest Port 1 View  Result Date: 03/07/2016 CLINICAL DATA:  Fall. EXAM: PORTABLE CHEST 1 VIEW COMPARISON:  08/27/2015 . FINDINGS: Cardiomegaly with normal pulmonary  vascularity. Low lung volumes with mild basilar atelectasis. No pneumothorax. No acute bony abnormality. Synostosis right anterior first and second wrist. Sclerotic density noted the right humerus most likely old bone infarct. Surgical clips right upper quadrant. IMPRESSION: Low lung volumes with mild basilar atelectasis. No acute bony abnormality. No pneumothorax. Electronically Signed   By: Marcello Moores  Register   On: 03/07/2016 08:59     Scheduled Meds: . Derrill Memo ON 03/09/2016] feeding supplement (ENSURE ENLIVE)  237 mL Oral TID WC   Continuous Infusions: . 0.9 % NaCl with KCl 40 mEq / L 100 mL/hr (03/08/16 2123)     LOS: 1 day    Time spent: 25 minutes    Barton Dubois, MD [facility] (  P) [phone]   If 7PM-7AM, please contact night-coverage [email or web address] Password [facility] 03/08/2016, 11:45 PM

## 2016-03-08 NOTE — Progress Notes (Addendum)
Pt sleeping no longer pulling at lines and tubing or actively attempting to get out of bed. Restraints STOPPED. Hand mittens remain on pts. Able to take oral intake without difficulty with assistance. Will continue to monitor behavior. SRP, RN

## 2016-03-08 NOTE — Progress Notes (Signed)
Pt continue to sleep and rest at intervals and is not trying to get OOB. Will continue to monitor. SRP,RN

## 2016-03-08 NOTE — Clinical Social Work Placement (Signed)
Patient has a bed at [facility]. CSW has completed FL2 & will continue to follow and assist with discharge when ready.    [name], [facility] Clinical Social Worker cell #: [phone]     CLINICAL SOCIAL WORK PLACEMENT  NOTE  Date:  03/08/2016  Patient Details  Name: Mitchell Grant MRN: [id number] Date of Birth: 06/30/1930  Clinical Social Work is seeking post-discharge placement for this patient at the [place] level of care (*CSW will initial, date and re-position this form in  chart as items are completed):  Yes   Patient/family provided with [place] Work Department's list of facilities offering this level of care within the geographic area requested by the patient (or if unable, by the patient's family).  Yes   Patient/family informed of their freedom to choose among providers that offer the needed level of care, that participate in Medicare, Medicaid or managed care program needed by the patient, have an available bed and are willing to accept the patient.  Yes   Patient/family informed of [facility]'s ownership interest in [facility] and [facility], as well as of the fact that they are under no obligation to receive care at these facilities.  PASRR submitted to EDS on 03/07/16     PASRR number received on 03/07/16     Existing PASRR number confirmed on       FL2 transmitted to all facilities in geographic area requested by pt/family on 03/07/16     FL2 transmitted to all facilities within larger geographic area on       Patient informed that his/her managed care company has contracts with or will negotiate with certain facilities, including the following:        Yes   Patient/family informed of bed offers received.  Patient chooses bed at [place]     Physician recommends and patient chooses bed at      Patient to be transferred to [facility]  on  .  Patient to be transferred to facility by       Patient family notified on   of transfer.  Name of family member notified:        PHYSICIAN       Additional Comment:    _______________________________________________ Standley Brooking, LCSW 03/08/2016, 2:14 PM

## 2016-03-08 NOTE — Progress Notes (Signed)
Initial Nutrition Assessment  DOCUMENTATION CODES:   Not applicable  INTERVENTION:  Recommend liberalizing diet from Heart Healthy to Regular in setting of poor PO intake.   Provide Ensure Enlive po TID with meals, each supplement provides 350 kcal and 20 grams of protein.   NUTRITION DIAGNOSIS:   Inadequate oral intake related to lethargy/confusion as evidenced by meal completion < 50%.  GOAL:   Patient will meet greater than or equal to 90% of their needs  MONITOR:   PO intake, Supplement acceptance, Labs, I & O's, Weight trends  REASON FOR ASSESSMENT:   Malnutrition Screening Tool    ASSESSMENT:   83 year old male with PMHx of DM type 2, HLD found to have acute encephalopathy, left-sided lung mass concerning for malignancy.    Attempted to speak with patient at bedside but he was very lethargic and did not fully participate. Patient reported his appetite was "okay." Patient's lunch delivered at time of RD assessment. Patient is unsure if he has been losing weight. No family at bedside.   Per chart patient was 165 lbs on 10/30/2014. He has lost 25 lbs (15.2% body weight) over an unknown time period within the past year and 4 months. Without knowing time period of weight loss, cannot determine if it was significant or not.   Meal Completion: 0-50%  Medications reviewed and include: NS with KCl 40 mEq/L @ 100 ml/hr.  Labs reviewed: Potassium 3.4, Chloride 112, BUN 27, Creatinine 1.71.   Limited Nutrition-Focused physical exam completed. Findings are mild fat depletion noted only in upper arm region, mild muscle depletion noted only in temple region, and no edema.   Discussed with RN. Patient has dementia. He is a full assist with meals and has been drinking some Ensure.   Diet Order:  Diet Heart Room service appropriate? Yes; Fluid consistency: Thin  Skin:  Reviewed, no issues  Last BM:  03/07/2016  Height:   Ht Readings from Last 1 Encounters:  03/07/16 '5\' 4"'$   (1.626 m)    Weight:   Wt Readings from Last 1 Encounters:  03/07/16 140 lb 10.5 oz (63.8 kg)    Ideal Body Weight:  59.1 kg  BMI:  Body mass index is 24.14 kg/m.  Estimated Nutritional Needs:   Kcal:  1500-1750 (MSJ x 1.2-1.4)  Protein:  65-78 grams (1-1.2 grams/kg)  Fluid:  1.6 L/day (25 ml/kg)  EDUCATION NEEDS:   Education needs no appropriate at this time  Willey Blade, MS, RD, LDN Pager: [phone] After Hours Pager: [phone]

## 2016-03-08 NOTE — Progress Notes (Signed)
Notified by CCMD that patient had 9 beat run of VTach. Vital signs obtained. Tylene Fantasia NP made aware. No new orders given. Will continue to monitor.  Notified Tylene Fantasia of patients reactive RPR result. No new orders received.

## 2016-03-08 NOTE — Care Management Note (Signed)
Case Management Note  Patient Details  Name: RONTE PARKER MRN: [id number] Date of Birth: 06/28/1930  Subjective/Objective: 83 y.o. male with unknown past medical history was brought to the ER after Adult Protective Services went to check on the patient and was found to be on the floor                   Action/Plan: Plan to discharge to SNF   Expected Discharge Date:  March 26, 2016               Expected Discharge Plan:  [place]  In-House Referral:  Clinical Social Work  Discharge planning Services  CM Consult  Post Acute Care Choice:  NA Choice offered to:  NA  DME Arranged:  N/A DME Agency:  NA  HH Arranged:    [place] Agency:  NA  Status of Service:  Completed, signed off  If discussed at [employer], dates discussed:    Additional CommentsPurcell Mouton, RN 03/08/2016, 2:04 PM

## 2016-03-09 DIAGNOSIS — M6282 Rhabdomyolysis: Secondary | ICD-10-CM

## 2016-03-09 DIAGNOSIS — R918 Other nonspecific abnormal finding of lung field: Secondary | ICD-10-CM

## 2016-03-09 DIAGNOSIS — R222 Localized swelling, mass and lump, trunk: Secondary | ICD-10-CM

## 2016-03-09 DIAGNOSIS — F0391 Unspecified dementia with behavioral disturbance: Secondary | ICD-10-CM

## 2016-03-09 DIAGNOSIS — N179 Acute kidney failure, unspecified: Secondary | ICD-10-CM

## 2016-03-09 DIAGNOSIS — R627 Adult failure to thrive: Secondary | ICD-10-CM

## 2016-03-09 DIAGNOSIS — F03918 Unspecified dementia, unspecified severity, with other behavioral disturbance: Secondary | ICD-10-CM

## 2016-03-09 DIAGNOSIS — I10 Essential (primary) hypertension: Secondary | ICD-10-CM

## 2016-03-09 MED ORDER — ACETAMINOPHEN 325 MG PO TABS: 650.0000 mg | ORAL_TABLET | Freq: Four times a day (QID) | ORAL | Status: AC | PRN

## 2016-03-09 MED ORDER — MORPHINE SULFATE (CONCENTRATE) 10 MG /0.5 ML PO SOLN: 5.0000 mg | ORAL | Status: AC | PRN

## 2016-03-09 MED ORDER — LORAZEPAM 2 MG/ML PO CONC: 1.0000 mg | Freq: Four times a day (QID) | ORAL | Status: AC | PRN

## 2016-03-09 NOTE — Progress Notes (Signed)
CSW received consult for residential hospice - spoke with APS worker, [place] who was at patient's house with his wife, confirmed that preference is for [facility]. CSW made referral to Erling Conte, [place] Liaison - awaiting response re: bed availability/eligibility. APS worker to bring patient's wife to hospital.    [name], [place] Social Worker cell #: [phone]

## 2016-03-09 NOTE — Progress Notes (Addendum)
Phone call with patient's wife and APS worker:  We discussed about the patient's acute and underlying illnesses, his declining functional status, risk for ongoing decline. All questions answered to the best of my ability.   PLAN: DNR DNI SCW consult for residential hospice.  Please coordinate with APS worker Ms [employer].   15 minutes spent on phone call, 15 minutes in chart review prior Loistine Chance MD [facility] health palliative medicine team [phone]

## 2016-03-09 NOTE — Progress Notes (Signed)
Patient is set to discharge to [facility] today. Patient's wife, Volney Presser & APS worker, Crystal made aware. Discharge packet given to RN, Catie. [facility] called for transport.     [name], [facility] Clinical Social Worker cell #: [phone]

## 2016-03-09 NOTE — Progress Notes (Signed)
Report called to [facility] at [facility]. Pt will be transported via [facility]. Patient and family informed.

## 2016-03-09 NOTE — Consult Note (Addendum)
Consultation Note Date: 03/09/2016   Patient Name: Mitchell Grant  DOB: 27-Apr-1930  MRN: [id number]  Age / Sex: 83 y.o., male  PCP: No Pcp Per Patient Referring Physician: Barton Dubois, MD  Reason for Consultation: Establishing goals of care  HPI/Patient Profile: 83 y.o. male  admitted on 03/06/2016    Clinical Assessment and Goals of Care:  83 year old gentleman with a history of diabetes, dementia, possible lung cancer. On 02-14-16, chart review notes a CT scan of the chest showing a new mass concerning for malignancy. Patient was referred to oncology was supposed to see Dr. Rogue Jury, was supposed to get a PET scan. Patient however has been admitted to the hospital since 03-06-16 after having been found on the floor at his home disheveled and covered in feces. He has been admitted with acute encephalopathy, hypertensive urgency, mildly elevated CPK, mild acute kidney injury, probable left lower lobe bronchogenic carcinoma.   Patient has not been able to have any meaningful recovery in his hospital course. He remains confused. He is not eating well. A palliative consultation has been requested for goals of care discussions and appropriate disposition planning and advanced directives discussions.  Mr. Baby is resting in bed. When asked if he is doing okay, he replies "I hope so." Other than that, he is not able to open his eyes not able to follow commands not able to participate in any appropriate discussions. He mumbles incoherently and moves all 4 extremities restlessly.  Call placed and unable to reach wife. Dialed home number, dialed cell phone. Call placed and discussed with Bethena Roys, wife of patient's cousin Domingo Mend from [place], [place]. Bethena Roys states that [employer] is an only child, does not have any adult children, wife has blindness and is not able to drive and possibly also has cognitive deficits,  likely does not have complete decision-making capacity. Patient's cousin Clair Gulling is next of kin although there is no established power of attorney paperwork. Clair Gulling unfortunately suffered a stroke recently and Jeneen Rinks family is unable to travel here to [place], [place] to check on the patient.  Bethena Roys states that the patient is a [place] veteran and had apparently filed a claim with the [place] several years ago as he thought he possibly was exposed to [employer]. Bethena Roys states that there is a lot of extended family in [place] and that the patient and his wife have Graves over there. She is asking for the patient to be sent to [place] so that she can care for the patient.  Based on the way Bethena Roys knows the patient, she does not believe that the patient would want aggressive/artificial measures at end-of-life. She believes that DO NOT RESUSCITATE/DO NOT INTUBATE would be most representative of the patient's wishes. She however asks that we check in with [employer] from [facility]. Hence, call placed and left a message for Crystal at [phone], awaiting call back.   NEXT OF KIN  Wife  SUMMARY OF RECOMMENDATIONS    1. Recommend DO NOT RESUSCITATE/DO NOT INTUBATE  2. Patient with declining functional status, minimal to nil oral intake, worsening dementia, underlying malignancy: Possible prognosis 2-4 weeks. Recommend sniff with hospice or even residential hospice. Call placed and left a message for [facility] [employer] provider [employer] at [phone]. Await callback for further discussions. Unable to reach wife. Discussed in detail with cousin Jeneen Rinks wife Bethena Roys who is in [place].  Addendum: Received call back from APS provider [place] at 8:40 AM on 03-09-16:  Patient has been connected with APS services since 02-28-16. Crystal found the patient down and called EMS that has led to this hospitalization. Crystal states that both the patient and his wife have a problem with hoarding, the  wife is hard of hearing, she is not able to hear or get to her phone.  PLAN: Crystal will go to the patient's house this morning, she will call me at 10 AM on 03-09-16, so that I may discuss with wife regarding code status and overall goals of care.  Further recommendations to follow.   Code Status/Advance Care Planning:  Full code    Symptom Management:    Continue current measures  Palliative Prophylaxis:   Delirium Protocol  Psycho-social/Spiritual:   Desire for further Chaplaincy support:no  Additional Recommendations: Education on Hospice  Prognosis:   < 2-4 weeks  Discharge Planning: To Be Determined      Primary Diagnoses: Present on Admission: . Acute encephalopathy . Hypertensive urgency . Metabolic encephalopathy   I have reviewed the medical record, interviewed the patient and family, and examined the patient. The following aspects are pertinent.  Past Medical History:  Diagnosis Date  . Diabetes mellitus without complication ([place])   . Hyperlipidemia    Social History   Social History  . Marital status: Married    Spouse name: N/A  . Number of children: N/A  . Years of education: N/A   Social History Main Topics  . Smoking status: Former Research scientist (life sciences)  . Smokeless tobacco: Never Used  . Alcohol use Yes  . Drug use: No  . Sexual activity: Not Asked   Other Topics Concern  . None   Social History Narrative  . None   Family History  Problem Relation Age of Onset  . Family history unknown: Yes   Scheduled Meds: . feeding supplement (ENSURE ENLIVE)  237 mL Oral TID WC   Continuous Infusions: . 0.9 % NaCl with KCl 40 mEq / L 100 mL/hr (03/09/16 0647)   PRN Meds:.acetaminophen **OR** acetaminophen, hydrALAZINE, ondansetron **OR** ondansetron (ZOFRAN) IV Medications Prior to Admission:  Prior to Admission medications   Medication Sig Start Date End Date Taking? Authorizing Provider  Multiple Vitamin (MULTIVITAMIN WITH MINERALS) TABS tablet  Take 1 tablet by mouth daily.    Historical Provider, MD  sennosides-docusate sodium (SENOKOT-S) 8.6-50 MG tablet Take 1 tablet by mouth daily.    Historical Provider, MD   No Known Allergies Review of Systems Not able to verbalize or follow commands  Physical Exam Weak Disheveled appearing elderly gentleman Moves about in bed restlessly, not agitated Not able to verbalize not able to follow commands Clear breath sounds anterior lung fields S1-S2 Abdomen soft nontender Confused and  Vital Signs: BP (!) 185/74 (BP Location: Left Arm)   Pulse (!) 53   Temp 97.3 F (36.3 C) (Axillary)   Resp 16   Ht '5\' 4"'$  (1.626 m)   Wt 63.8 kg (140 lb 10.5 oz)   SpO2 95%   BMI 24.14 kg/m  Pain Assessment: No/denies pain  SpO2: SpO2: 95 % O2 Device:SpO2: 95 % O2 Flow Rate: .   IO: Intake/output summary:  Intake/Output Summary (Last 24 hours) at 03/09/16 0757 Last data filed at 03/09/16 0600  Gross per 24 hour  Intake             2640 ml  Output                0 ml  Net             2640 ml    LBM: Last BM Date: 03/07/16 Baseline Weight: Weight: 63.8 kg (140 lb 10.5 oz) Most recent weight: Weight: 63.8 kg (140 lb 10.5 oz)     Palliative Assessment/Data:   Flowsheet Rows   Flowsheet Row Most Recent Value  Intake Tab  Referral Department  Hospitalist  Unit at Time of Referral  Oncology Unit  Palliative Care Primary Diagnosis  Cancer  Palliative Care Type  New Palliative care  Reason for referral  Clarify Goals of Care  Date first seen by Palliative Care  03/09/16  Clinical Assessment  Palliative Performance Scale Score  20%  Pain Max last 24 hours  4  Pain Min Last 24 hours  3  Dyspnea Max Last 24 Hours  3  Dyspnea Min Last 24 hours  2  Nausea Max Last 24 Hours  3  Psychosocial & Spiritual Assessment  Palliative Care Outcomes  Patient/Family meeting held?  Yes  Who was at the meeting?  next of kin cousin's wife Bethena Roys in [place], discussed over the phone.   Palliative  Care Outcomes  Clarified goals of care      Time In:  7 Time Out:  8 Time Total:  60 Greater than 50%  of this time was spent counseling and coordinating care related to the above assessment and plan.  Signed by: Loistine Chance, MD  [phone] Please contact Palliative Medicine Team phone at [phone] for questions and concerns.  For individual provider: See Shea Evans

## 2016-03-09 NOTE — Discharge Summary (Signed)
Physician Discharge Summary  Mitchell Grant JGG:[id number] DOB: March 13, 1930 DOA: 03/06/2016  PCP: No PCP Per Patient  Admit date: 03/06/2016 Discharge date: 03/09/2016  Time spent: 35 minutes  Recommendations for Outpatient Follow-up:  Full comfort Care.  Discharge Diagnoses:  Principal Problem:   Acute encephalopathy Active Problems:   Hypertensive urgency   Metabolic encephalopathy   Failure to thrive in adult   Dementia with behavioral disturbance   Essential hypertension   Discharge Condition: end of life care and symptomatic management. Discharge to [facility].  Diet recommendation: comfort feeding   Filed Weights   03/07/16 0010  Weight: 63.8 kg (140 lb 10.5 oz)    History of present illness:  As per Dr. Hal Hope H&P written on 03/06/16 83 y.o. male with unknown past medical history was brought to the ER after Adult Protective Services went to check on the patient and was found to be on the floor. Patient had urine and feces in his underwear. In the ER patient remained confused and oriented to his name only. Was afebrile. CT of the head and C-spine did not show anything acute. Blood pressure remained high. Patient is being admitted for further observation. Unable to reach patient's wife who usually lives with patient. Reviewing patient's charts last month patient had x-ray and CT scan of the chest which showed new mass concerning for malignancy.   Hospital Course:  Metabolic encephalopathy superimposed on dementia-unclear etiology.  -TSH normal and no acute abnormalities on CT -RPR weakly reactive with titers of 1:2; FTA-ABS for confirmation sent and pending at discharge. -patient unable to elaborate if he had hx or syphilis or treatment for it in the past (which will explained positive RPR) -at this moment plan is for full comfort care and no further antibiotic of invasive treatment will be pursuit (as per family request)  -unsafe at home and unable to be taken care  off -per APS and discussion with cousin during [facility] meeting; decision is for full comfort care and hospice therapy. Patient will be discharge to [facility] for further end of life and symptomatic management.  -HIV non reactive  Dehydration/volume depletion.  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  -Palliative care was consulted for advance directives and code status clarification -decision is for full comfort care and hospice -prediction is for less than 2 weeks; patient not really eating or drinking much  Severe hypokalemia -potassium 2.9 on admission  -Repleted  -no further labs -plan is to focus on comfort care  Leukocytosis etiology unclear  -no infectious source suspected -improved/resolved with IVF's  Mildly elevated CK total 670 on admission and mild AKI -probably secondary to be on the floor when found, unknown for how long -no much response with IVF's -plan is to pursuit full comfort care and hospice  Procedures:  See below for x-ray reports   Consultations:  Palliative Care  Discharge Exam: Vitals:   03/09/16 0642 03/09/16 1300  BP: (!) 185/74 (!) 171/68  Pulse: (!) 53 65  Resp: 16 (!) 30  Temp: 97.3 F (36.3 C) 97.5 F (36.4 C)   General exam: Appears calmed and comfortable. No CP, no SOB. Sleeping and w/o much interaction. Per nursing report not eating or drinking much. Respiratory system: Clear to auscultation anteriorly, poor exam posteriorly Cardiovascular system: S1 & S2 heard, RRR. No  JVD, murmurs, rubs, gallops or clicks. No pedal edema. Gastrointestinal system: Abdomen is non tedner Neurologic: Confused/lethargic, Moves 4 limbs spontaneously, poor judgement and insight    Discharge Instructions   Discharge Instructions    Discharge instructions     Complete by:  As directed    Full comfort Care     Current Discharge Medication List    START taking these medications   Details  acetaminophen (TYLENOL) 325 MG tablet Take 2 tablets (650 mg total) by mouth every 6 (six) hours as needed for mild pain (or Fever >/= 101).    LORazepam (ATIVAN) 2 MG/ML concentrated solution Take 0.5-1 mLs (1-2 mg total) by mouth every 6 (six) hours as needed for anxiety (agitation and comfort).    Morphine Sulfate (MORPHINE CONCENTRATE) 10 mg / 0.5 ml concentrated solution Take 0.25 mLs (5 mg total) by mouth every 3 (three) hours as needed for severe pain.      CONTINUE these medications which have NOT CHANGED   Details  sennosides-docusate sodium (SENOKOT-S) 8.6-50 MG tablet Take 1 tablet by mouth daily.      STOP taking these medications     Multiple Vitamin (MULTIVITAMIN WITH MINERALS) TABS tablet        No Known Allergies    The results of significant diagnostics from this hospitalization (including imaging, microbiology, ancillary and laboratory) are listed below for reference.    Significant Diagnostic Studies: Ct Head Wo Contrast  Result Date: 03/06/2016 CLINICAL DATA:  Patient found down today. EXAM: CT HEAD WITHOUT CONTRAST CT CERVICAL SPINE WITHOUT CONTRAST TECHNIQUE: Multidetector CT imaging of the head and cervical spine was performed following the standard protocol without intravenous contrast. Multiplanar CT image reconstructions of the cervical spine were also generated. COMPARISON:  Head CT scan and brain MRI 10/30/2014. FINDINGS: CT HEAD FINDINGS Brain: Atrophy and chronic microvascular ischemic change are identified. Small prepontine mass on the left measures approximately 1 cm in diameter and is most consistent with a meningioma, unchanged. No other mass is identified. No evidence of acute infarct, hemorrhage, midline shift or abnormal extra-axial fluid collection. No hydrocephalus or pneumocephalus. Vascular: Atherosclerosis  noted. Skull: Intact. Sinuses/Orbits: Mild mucosal thickening left maxillary sinus is seen. No acute abnormality. Other: None. CT CERVICAL SPINE FINDINGS Alignment: Facet mediated 0.3 cm anterolisthesis C4 on C5 is identified. Otherwise maintained. Convex right scoliosis is noted. Skull base and vertebrae: No fracture or worrisome lesion. 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 Soft tissues and spinal canal: Carotid atherosclerosis is identified. No hematoma. No prevertebral soft tissue swelling. Disc levels: Loss of disc space height is worst at C6-7. Scattered facet degenerative change appears worst on the left at C4-5. Upper chest: Lung apices are clear. Other: None. IMPRESSION: No acute abnormality head or cervical spine. Atrophy and chronic microvascular ischemic change. No change in a small prepontine meningioma. 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 Cervical spondylosis appears worst at C5-6. Atherosclerosis. Electronically Signed   By: Inge Rise M.D.   On: 03/06/2016 17:55   Ct Chest W Contrast  Result Date: 02/14/2016 CLINICAL DATA:  Abnormal chest radiograph. Posterior chest pain for 2 weeks. EXAM: CT CHEST WITH CONTRAST TECHNIQUE: Multidetector CT imaging of the chest was performed during intravenous contrast administration. CONTRAST:  14m ISOVUE-300 IOPAMIDOL (ISOVUE-300) INJECTION 61% COMPARISON:  Chest radiograph 08/27/2015. FINDINGS: Cardiovascular: Atherosclerotic calcification of the arterial vasculature, including three-vessel involvement of the coronary arteries. Heart is enlarged. No pericardial effusion. Mediastinum/Nodes: No pathologically enlarged mediastinal, hilar or axillary lymph nodes. Esophagus is grossly unremarkable. Lungs/Pleura:  Moderate centrilobular emphysema. Mild scarring in the right middle lobe and lingula. An irregular cystic and solid nodule in the peripheral left lower lobe measures 1.3 x 1.7 cm (series 5, image 78). No pleural fluid. Airway is unremarkable. Upper Abdomen:  Subcentimeter low-attenuation lesions in the liver too small to characterize but cysts are most likely. Visualized portions of the liver and right adrenal gland are unremarkable. 1.5 cm nodule in the left adrenal gland measures 59 Hounsfield units. Low-attenuation lesions in the kidneys measure up to 1.4 cm on the right and are likely cysts, statistically. Tiny stone in the left kidney. Visualized portions of the spleen, pancreas, stomach and small bowel are grossly unremarkable. Postoperative changes in the right colon. Cholecystectomy. No upper abdominal adenopathy. Musculoskeletal: No worrisome lytic or sclerotic lesions. Old right rib fractures. IMPRESSION: 1. Irregular cystic and solid nodule in the left lower lobe, worrisome for primary bronchogenic carcinoma. These results will be called to the ordering clinician or representative by the Radiologist Assistant, and communication documented in the PACS or zVision Dashboard. 2. Aortic atherosclerosis (ICD10-170.0). Three-vessel coronary artery calcification. 3. Left adrenal nodule, indeterminate. 4. Tiny left renal stone. Electronically Signed   By: Lorin Picket M.D.   On: 02/14/2016 11:18   Ct Cervical Spine Wo Contrast  Result Date: 03/06/2016 CLINICAL DATA:  Patient found down today. EXAM: CT HEAD WITHOUT CONTRAST CT CERVICAL SPINE WITHOUT CONTRAST TECHNIQUE: Multidetector CT imaging of the head and cervical spine was performed following the standard protocol without intravenous contrast. Multiplanar CT image reconstructions of the cervical spine were also generated. COMPARISON:  Head CT scan and brain MRI 10/30/2014. FINDINGS: CT HEAD FINDINGS Brain: Atrophy and chronic microvascular ischemic change are identified. Small prepontine mass on the left measures approximately 1 cm in diameter and is most consistent with a meningioma, unchanged. No other mass is identified. No evidence of acute infarct, hemorrhage, midline shift or abnormal extra-axial  fluid collection. No hydrocephalus or pneumocephalus. Vascular: Atherosclerosis noted. Skull: Intact. Sinuses/Orbits: Mild mucosal thickening left maxillary sinus is seen. No acute abnormality. Other: None. CT CERVICAL SPINE FINDINGS Alignment: Facet mediated 0.3 cm anterolisthesis C4 on C5 is identified. Otherwise maintained. Convex right scoliosis is noted. Skull base and vertebrae: No fracture or worrisome lesion. 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 Soft tissues and spinal canal: Carotid atherosclerosis is identified. No hematoma. No prevertebral soft tissue swelling. Disc levels: Loss of disc space height is worst at C6-7. Scattered facet degenerative change appears worst on the left at C4-5. Upper chest: Lung apices are clear. Other: None. IMPRESSION: No acute abnormality head or cervical spine. Atrophy and chronic microvascular ischemic change. No change in a small prepontine meningioma. 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 Cervical spondylosis appears worst at C5-6. Atherosclerosis. Electronically Signed   By: Inge Rise M.D.   On: 03/06/2016 17:55   Dg Pelvis Portable  Result Date: 03/07/2016 CLINICAL DATA:  Fall, confusion EXAM: PORTABLE PELVIS 1-2 VIEWS COMPARISON:  None. FINDINGS: Single frontal view of the pelvis submitted. No acute fracture or subluxation. Mild degenerative changes are noted bilateral hip joints with mild narrowing superior joint space and mild superior acetabular spurring. Bilateral hip joints are symmetrical in appearance. Pubic symphysis is unremarkable IMPRESSION: No acute fracture or subluxation. Mild degenerative changes bilateral hip joints. Electronically Signed   By: Lahoma Crocker M.D.   On: 03/07/2016 08:58   Dg Chest Port 1 View  Result Date: 03/07/2016 CLINICAL DATA:  Fall. EXAM: PORTABLE CHEST 1 VIEW COMPARISON:  08/27/2015 . FINDINGS: Cardiomegaly with  normal pulmonary vascularity. Low lung volumes with mild basilar atelectasis. No pneumothorax. No acute bony abnormality.  Synostosis right anterior first and second wrist. Sclerotic density noted the right humerus most likely old bone infarct. Surgical clips right upper quadrant. IMPRESSION: Low lung volumes with mild basilar atelectasis. No acute bony abnormality. No pneumothorax. Electronically Signed   By: Marcello Moores  Register   On: 03/07/2016 08:59   Labs: Basic Metabolic Panel:  Recent Labs Lab 03/06/16 1714 03/07/16 0537 03/08/16 0849  NA 145 145 144  K 3.2* 2.9* 3.4*  CL 105 109 112*  CO2 '27 27 25  '$ GLUCOSE 120* 97 115*  BUN 26* 21* 27*  CREATININE 0.85 0.80 1.71*  CALCIUM 9.7 9.0 8.8*  MG  --  2.2 2.1   Liver Function Tests:  Recent Labs Lab 03/06/16 1714 03/07/16 0823  AST 56* 53*  ALT 44 28  ALKPHOS 52 44  BILITOT 3.0* 2.3*  PROT 7.8 6.4*  ALBUMIN 4.2 3.6    Recent Labs Lab 03/06/16 1827  AMMONIA 12   CBC:  Recent Labs Lab 03/06/16 1714 03/07/16 0537 03/07/16 0823  WBC 13.4* 10.9*  --   NEUTROABS 10.9*  --   --   HGB 15.9 14.3  --   HCT 44.5 41.1 41.1  MCV 85.2 85.6  --   PLT 164 167  --    Cardiac Enzymes:  Recent Labs Lab 03/06/16 1714 03/07/16 0537  CKTOTAL 671* 681*    Signed:  Barton Dubois MD.  [facility] 03/09/2016, 3:39 PM

## 2016-03-10 LAB — FLUORESCENT TREPONEMAL AB(FTA)-IGG-BLD: Fluorescent Treponemal Ab, IgG: NONREACTIVE

## 2016-03-16 DEATH — deceased

## 2018-03-13 IMAGING — DX DG CHEST 1V PORT
2 series · 2 of 2 positions shown · non-contrast
Comparison: 08/27/2015 .

CLINICAL DATA: Fall.

EXAM:
PORTABLE CHEST 1 VIEW

[chest ap (1 of 2)]
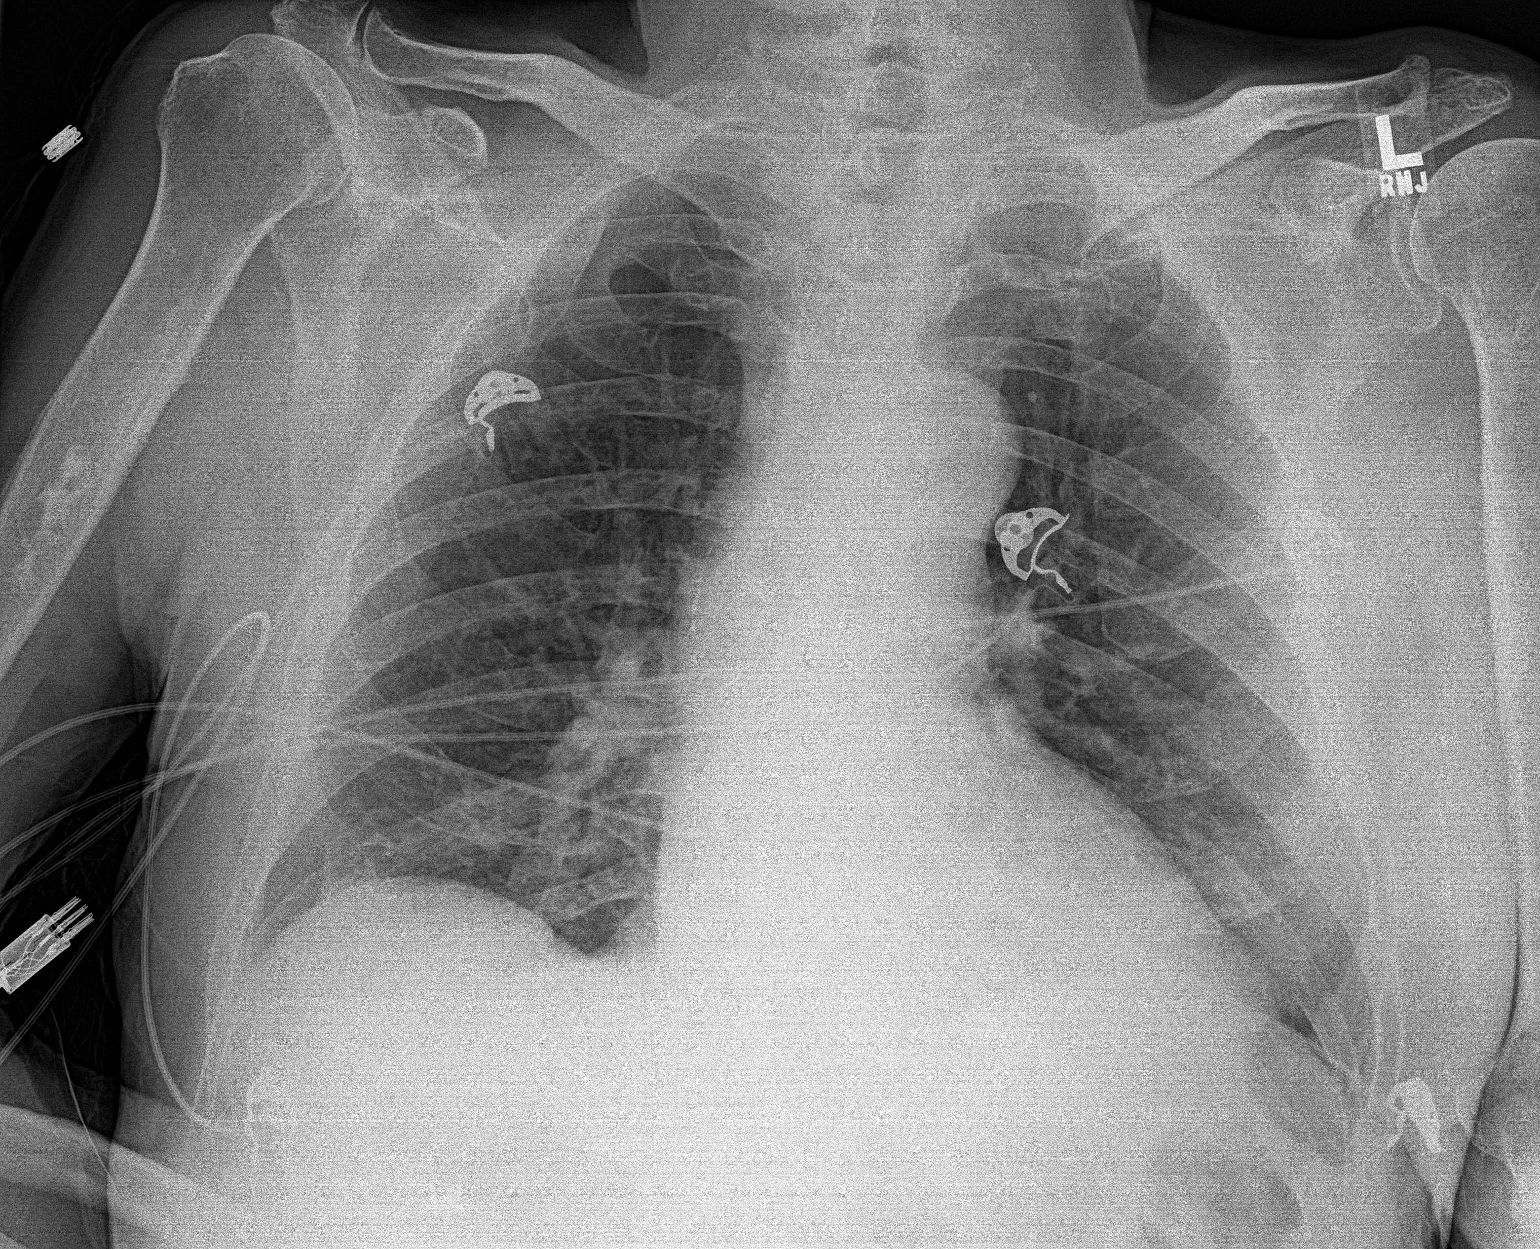

[chest ap (2 of 2)]
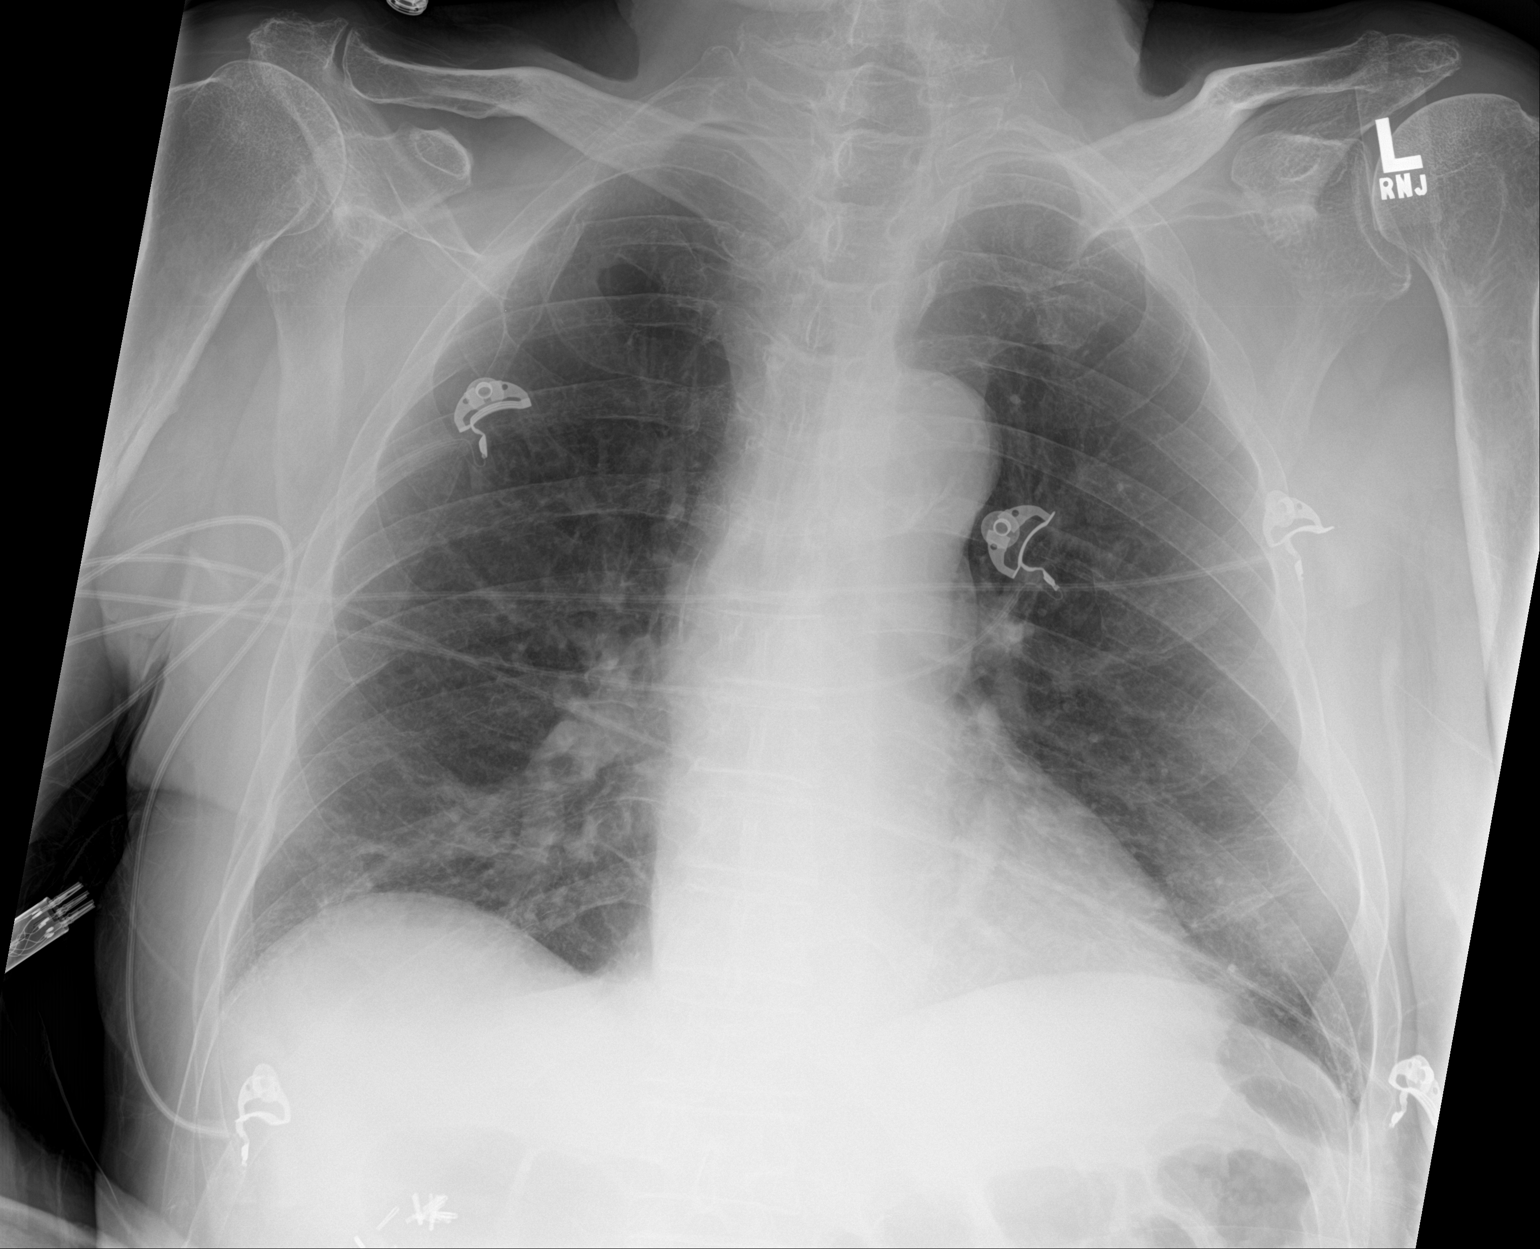

[2 of 2 positions shown; findings below may reference images not displayed]

FINDINGS: Cardiomegaly with normal pulmonary vascularity. Low lung volumes
with mild basilar atelectasis. No pneumothorax. No acute bony
abnormality. Synostosis right anterior first and second wrist.
Sclerotic density noted the right humerus most likely old bone
infarct. Surgical clips right upper quadrant.
IMPRESSION: Low lung volumes with mild basilar atelectasis. No acute bony
abnormality. No pneumothorax.
